# Patient Record
Sex: Female | Born: 1990 | Race: Black or African American | Hispanic: No | Marital: Single | Smoking: Former smoker
Health system: Southern US, Community
[De-identification: ages and names within clinical notes are randomized; demographics above are authoritative.]

## PROBLEM LIST (undated history)

## (undated) DIAGNOSIS — F419 Anxiety disorder, unspecified: Secondary | ICD-10-CM

## (undated) DIAGNOSIS — I729 Aneurysm of unspecified site: Secondary | ICD-10-CM

## (undated) DIAGNOSIS — G43909 Migraine, unspecified, not intractable, without status migrainosus: Secondary | ICD-10-CM

## (undated) DIAGNOSIS — L309 Dermatitis, unspecified: Secondary | ICD-10-CM

## (undated) DIAGNOSIS — F29 Unspecified psychosis not due to a substance or known physiological condition: Secondary | ICD-10-CM

## (undated) DIAGNOSIS — Z889 Allergy status to unspecified drugs, medicaments and biological substances status: Secondary | ICD-10-CM

## (undated) DIAGNOSIS — T783XXA Angioneurotic edema, initial encounter: Secondary | ICD-10-CM

## (undated) DIAGNOSIS — R569 Unspecified convulsions: Secondary | ICD-10-CM

## (undated) DIAGNOSIS — F329 Major depressive disorder, single episode, unspecified: Secondary | ICD-10-CM

## (undated) DIAGNOSIS — F32A Depression, unspecified: Secondary | ICD-10-CM

## (undated) HISTORY — PX: OTHER SURGICAL HISTORY: SHX169

## (undated) HISTORY — PX: TONSILLECTOMY: SUR1361

## (undated) HISTORY — DX: Angioneurotic edema, initial encounter: T78.3XXA

## (undated) HISTORY — PX: ADENOIDECTOMY: SUR15

## (undated) HISTORY — DX: Dermatitis, unspecified: L30.9

## (undated) HISTORY — PX: TYMPANOSTOMY TUBE PLACEMENT: SHX32

---
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
## 2011-01-09 ENCOUNTER — Inpatient Hospital Stay (INDEPENDENT_AMBULATORY_CARE_PROVIDER_SITE_OTHER)
Admission: RE | Admit: 2011-01-09 | Discharge: 2011-01-09 | Disposition: A | Discharge status: Home / Self Care | Payer: Medicaid Other | Source: Ambulatory Visit | Attending: Family Medicine | Admitting: Family Medicine

## 2011-01-09 DIAGNOSIS — J309 Allergic rhinitis, unspecified: Secondary | ICD-10-CM

## 2011-01-09 DIAGNOSIS — J45909 Unspecified asthma, uncomplicated: Secondary | ICD-10-CM

## 2011-01-14 LAB — CBC
HCT: 41.1 % (ref 36.0–49.0)
Hemoglobin: 14.1 g/dL (ref 12.0–16.0)
MCHC: 34.4 g/dL (ref 31.0–37.0)
MCV: 85.8 fL (ref 78.0–98.0)
Platelets: 299 10*3/uL (ref 150–400)
RBC: 4.8 MIL/uL (ref 3.80–5.70)
RDW: 13.6 % (ref 11.4–15.5)
WBC: 10.3 10*3/uL (ref 4.5–13.5)

## 2011-01-14 LAB — URINALYSIS, ROUTINE W REFLEX MICROSCOPIC
Bilirubin Urine: NEGATIVE
Glucose, UA: NEGATIVE mg/dL
Hgb urine dipstick: NEGATIVE
Ketones, ur: NEGATIVE mg/dL
Nitrite: NEGATIVE
Protein, ur: NEGATIVE mg/dL
Specific Gravity, Urine: 1.014 (ref 1.005–1.030)
Urobilinogen, UA: 0.2 mg/dL (ref 0.0–1.0)
pH: 6.5 (ref 5.0–8.0)

## 2011-01-14 LAB — DIFFERENTIAL
Basophils Absolute: 0 10*3/uL (ref 0.0–0.1)
Basophils Relative: 0 % (ref 0–1)
Eosinophils Absolute: 0.3 10*3/uL (ref 0.0–1.2)
Eosinophils Relative: 3 % (ref 0–5)
Lymphocytes Relative: 9 % — ABNORMAL LOW (ref 24–48)
Lymphs Abs: 0.9 10*3/uL — ABNORMAL LOW (ref 1.1–4.8)
Monocytes Absolute: 0.4 10*3/uL (ref 0.2–1.2)
Monocytes Relative: 4 % (ref 3–11)
Neutro Abs: 8.6 10*3/uL — ABNORMAL HIGH (ref 1.7–8.0)
Neutrophils Relative %: 84 % — ABNORMAL HIGH (ref 43–71)

## 2011-01-14 LAB — BASIC METABOLIC PANEL
BUN: 5 mg/dL — ABNORMAL LOW (ref 6–23)
CO2: 28 mEq/L (ref 19–32)
Calcium: 9.7 mg/dL (ref 8.4–10.5)
Chloride: 106 mEq/L (ref 96–112)
Creatinine, Ser: 0.66 mg/dL (ref 0.4–1.2)
Glucose, Bld: 99 mg/dL (ref 70–99)
Potassium: 4.2 mEq/L (ref 3.5–5.1)
Sodium: 141 mEq/L (ref 135–145)

## 2011-01-14 LAB — POCT PREGNANCY, URINE: Preg Test, Ur: NEGATIVE

## 2011-01-25 LAB — CBC
HCT: 32.4 % — ABNORMAL LOW (ref 36.0–49.0)
HCT: 34.6 % — ABNORMAL LOW (ref 36.0–49.0)
Hemoglobin: 10.8 g/dL — ABNORMAL LOW (ref 12.0–16.0)
Hemoglobin: 11.3 g/dL — ABNORMAL LOW (ref 12.0–16.0)
MCHC: 32.7 g/dL (ref 31.0–37.0)
MCHC: 33.2 g/dL (ref 31.0–37.0)
MCV: 88.7 fL (ref 78.0–98.0)
MCV: 89.6 fL (ref 78.0–98.0)
Platelets: 229 10*3/uL (ref 150–400)
Platelets: 264 10*3/uL (ref 150–400)
RBC: 3.65 MIL/uL — ABNORMAL LOW (ref 3.80–5.70)
RBC: 3.87 MIL/uL (ref 3.80–5.70)
RDW: 13 % (ref 11.4–15.5)
RDW: 13.5 % (ref 11.4–15.5)
WBC: 10.1 10*3/uL (ref 4.5–13.5)
WBC: 15.1 10*3/uL — ABNORMAL HIGH (ref 4.5–13.5)

## 2011-01-25 LAB — RPR: RPR Ser Ql: NONREACTIVE
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
## 2011-02-23 NOTE — Consult Note (Signed)
NAMELITZY, DICKER NO.:  [id number]   MEDICAL RECORD NO.:  [id number]          PATIENT TYPE:  EMS   LOCATION:  MAJO                         FACILITY:  [facility]   PHYSICIAN:  Antony Contras, MD     DATE OF BIRTH:  Sep 05, 1991   DATE OF CONSULTATION:  01/03/2009  DATE OF DISCHARGE:  01/03/2009                                 CONSULTATION   REQUESTING SERVICE:  Emergency Department.   CHIEF COMPLAINT:  Lip laceration.   HISTORY OF PRESENT ILLNESS:  The patient is a 20 year old African  American female, who fell against a headboard for her bed while tripping  over book bag at about 4:30 this afternoon.  She struck her face against  the headboard and her lip bled and hurt.  Pain is moderate.  She denies  any tooth damage.  She presents to the emergency department with this  injury.   PAST MEDICAL HISTORY:  1. Allergies.  2. Asthma.  3. Attention deficit disorder.  4. Bell palsy.  5. Depression.  6. Eczema.   PAST SURGICAL HISTORY:  Repair of left arm fracture and  adenotonsillectomy.   MEDICATIONS:  Albuterol and Singulair.   ALLERGIES:  No known drug allergies.   FAMILY HISTORY:  Hypertension and coronary artery disease.   SOCIAL HISTORY:  The patient lives with her mother and son.  She denies  smoking or alcohol use.   REVIEW OF SYSTEMS:  Negative except as listed above.   PHYSICAL EXAMINATION:  VITAL SIGNS:  Temperature 98.5, pulse 84, and  respirations 19.  GENERAL:  The patient is in no acute distress, is pleasant and  cooperative.  EYES:  Extraocular movements are intact.  Pupils are equal, round, and  reactive to light.  There is no orbital step-off.  EARS:  External ears are normal and external canals are patent.  Tympanic membranes are intact.  Middle ear spaces are aerated.  FACE:  There are no injuries to the face except to the upper lip as  described below.  NOSE:  External nose is normal.  Nasal passages are patent.  Septum is  relatively midline.  ORAL CAVITY/OROPHARYNX:  There is a 2.5-cm laceration of the right upper  lip that it crosses the vermilion border and extends through-and-through  into the inside of the lip in a vertical fashion.  The very top of the  laceration is split into 2 arms.  There are no other injuries to the  lips, teeth, oral cavity, or oropharynx.  NECK:  No tenderness or deformity.  CRANIAL NERVES:  II through XII are grossly intact.  THYROID:  Normal palpation.  LYMPHATIC:  No enlarged lymph nodes in the neck.  SALIVARY GLANDS:  Normal to palpation.   ASSESSMENT:  The patient is a 20 year old African American female with a  lip laceration.   PLAN:  The laceration will be repaired in the emergency department under  local anesthetic.  Risks, benefits, and alternatives were discussed.  Wound Care will consist of twice daily antibacterial ointment on the  outer part of the laceration.  The skin  sutures will be removed in about  a week in the office.       Antony Contras, MD  Electronically Signed     DDB/MEDQ  D:  01/03/2009  T:  01/04/2009  Job:  [id number]

## 2011-02-23 NOTE — Op Note (Signed)
NAMEXCARET, MORAD NO.:  [id number]   MEDICAL RECORD NO.:  [id number]          PATIENT TYPE:  EMS   LOCATION:  MAJO                         FACILITY:  [facility]   PHYSICIAN:  Antony Contras, MD     DATE OF BIRTH:  10/21/1990   DATE OF PROCEDURE:  01/03/2009  DATE OF DISCHARGE:  01/03/2009                               OPERATIVE REPORT   PREOPERATIVE DIAGNOSIS:  Right upper lip laceration, 2.5 cm.   POSTOPERATIVE DIAGNOSIS:  Right upper lip laceration, 2.5 cm.   PROCEDURE:  Intermediate complexity closure of upper lip laceration  totaling 2.5 cm.   SURGEON:  Antony Contras, MD.   ANESTHESIA:  Local.   COMPLICATIONS:  None.   INDICATION:  The patient is a 20 year old African American female who  struck her lip against the headboard when she tripped earlier this  afternoon.  She sustained a 2.5 cm vertical laceration through-and-  through the lip and across the vermilion border.  The laceration is  being closed in the operating room.   FINDINGS:  As above.   DESCRIPTION OF PROCEDURE:  The patient was identified in the emergency  department and informed consent was obtained including discussion of  risks, benefits, alternatives.  The upper lip and surrounding face was  prepped and draped in sterile fashion.  The laceration was copiously  irrigated with saline.  The deep muscle of the lip was then closed  together using 4-0 Vicryl suture in a simple interrupted fashion.  The  right upper lip was then closed with 5-0 chromic in a simple interrupted  fashion.  The vermilion border was repositioned with that suture.  The  skin portion of the laceration was then closed with 5-0 nylon in a  simple interrupted fashion.  After this, the patient return to emergency  room care and discharged with instructions.      Antony Contras, MD  Electronically Signed     DDB/MEDQ  D:  01/03/2009  T:  01/04/2009  Job:  [id number]

## 2011-02-26 NOTE — Discharge Summary (Signed)
Angela Spencer, Angela Spencer NO.:  [id number]   MEDICAL RECORD NO.:  [id number]           PATIENT TYPE:   LOCATION:                                 FACILITY:   PHYSICIAN:  Huel Cote, M.D. DATE OF BIRTH:  Dec 22, 1990   DATE OF ADMISSION:  11/08/2008  DATE OF DISCHARGE:                               DISCHARGE SUMMARY   DISCHARGE DIAGNOSES:  1. Term pregnancy at 39 weeks delivered.  2. Status post vacuum-assisted vaginal delivery.   DISCHARGE MEDICATIONS:  1. Motrin 600 mg p.o. every 6 hours.  2. Percocet 1-2 tablets p.o. every 4 hours.   DISCHARGE FOLLOWUP:  The patient is to follow up in the office in 6  weeks for her full postpartum exam.   HOSPITAL COURSE:  The patient is a 20 year old G1 P0 who came in at 38  weeks' gestation with complaint of contractions every 1-3 minutes and  rated her pain as 6/10.  She was observed with minimal cervical change  noted for 2 hours but she felt she could not go home and tolerate the  pain, and her mother was concerned about getting her back in inclement  weather as they live greater than 1 hour away; therefore, the patient  was allowed to stay and be medicated with the understanding if her  cervix did not change we would essentially began an induction of labor  as she was term status and favorable.  The patient and family were  agreeable.  Prenatal care was mostly complicated by significant social  issues of a 43 year old living with her boyfriend in a less than ideal  situation.  Mother was supportive, but the patient did not agree to live  with her.   Prenatal labs are as follows, O positive, antibody negative, rubella  immune, hepatitis B surface antigen negative, HIV negative, RPR  nonreactive, GC negative, chlamydia negative, group B strep negative, 1-  hour Glucola normal, and first trimester screen normal.   PAST OBSTETRICAL HISTORY:  None.   PAST GYN HISTORY:  History of an ASCUS Pap with [facility].   PAST  MEDICAL HISTORY:  Asthma and depression.   PAST SURGICAL HISTORY:  In 2000, she had some pins in her arm.   MEDICATIONS:  She uses albuterol p.r.n.  After being observed for  several hours, the patient's cervix really did not change, too  significantly she was 52 plus and -2 station; therefore, she had rupture  of membranes performed and was placed on Pitocin.  She did progress  reached approximately 5 cm and did have some decelerations down to  approximately 100 with some rebound tachycardia.  The strip was reviewed  personally by me and the time to be overall reassuring except for the  decelerations.  Therefore, she was allowed to continue to labor.  She  continued to have some intermittent decelerations.  However, the baby  always responded well to repositioning and scalp stem.  She eventually  reached complete dilation and pushed great despite persistent variable  decelerations with the pushes of fetal heart rate to 70-80.  The  patient's  baby's heart rate did recover back to baseline and maintained  excellent variability and scalp stem.  The patient was allowed to  continue pushing and did bring the vertex down to a +3 to +3 station.  After approximately 1 hour, however, given the persistent decelerations  and a slightly prolonged recovery that was beginning to be noted, the  patient was counseled and agreed to proceed with a vacuum-assisted  delivery to try to facilitate a quicker delivery of the baby.  She had a  soft cup vacuum applied to the vertex and was delivered over three  contractions.  There was a moderate shoulder dystocia encountered, which  was delivered with McRobertson posterior axillary lift.  The baby was  very floppy delivery with a corporal cord noted.  the Pediatrics was  called and assessed the baby.  Apgars were 2 and 9, weight was 7 pounds  4 ounces.  The baby did respond very quickly to stimulation and moved  both arms very well and appeared to be doing  fine.  The placenta  delivered spontaneously and then the patient had some uterine atony,  which was controlled with Pitocin and bimanual massage.  She had a small  first-degree laceration repaired with 3-0 Vicryl Rapide and a right  labial laceration also repaired.  On postpartum day #1, she was doing  quite well.  Hemoglobin was 10.8.  Fundus was firm.  By postpartum day  #2, she was felt stable for discharge home.  She was discharged with  office instructions and follow up in 6 weeks plan and also got a social  work consult given her significant social issues and has a plan of  support in place.      Huel Cote, M.D.  Electronically Signed     KR/MEDQ  D:  12/18/2008  T:  12/19/2008  Job:  [id number]
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
## 2011-07-05 LAB — CBC
HCT: 38.8
Hemoglobin: 12.8
MCHC: 33
MCV: 82
Platelets: 389
RBC: 4.74
RDW: 12.4
WBC: 7.8

## 2011-07-05 LAB — URINALYSIS, ROUTINE W REFLEX MICROSCOPIC
Bilirubin Urine: NEGATIVE
Glucose, UA: NEGATIVE
Hgb urine dipstick: NEGATIVE
Ketones, ur: NEGATIVE
Nitrite: NEGATIVE
Protein, ur: NEGATIVE
Specific Gravity, Urine: 1.015
Urobilinogen, UA: 0.2
pH: 7

## 2011-07-05 LAB — DRUGS OF ABUSE SCREEN W/O ALC, ROUTINE URINE
Amphetamine Screen, Ur: NEGATIVE
Barbiturate Quant, Ur: NEGATIVE
Benzodiazepines.: NEGATIVE
Cocaine Metabolites: NEGATIVE
Creatinine,U: 104.7
Marijuana Metabolite: NEGATIVE
Methadone: NEGATIVE
Opiate Screen, Urine: NEGATIVE
Phencyclidine (PCP): NEGATIVE
Propoxyphene: NEGATIVE

## 2011-07-05 LAB — DIFFERENTIAL
Basophils Absolute: 0
Basophils Relative: 0
Eosinophils Absolute: 0.5
Eosinophils Relative: 7 — ABNORMAL HIGH
Lymphocytes Relative: 20 — ABNORMAL LOW
Lymphs Abs: 1.6
Monocytes Absolute: 0.6
Monocytes Relative: 8
Neutro Abs: 5.1
Neutrophils Relative %: 65

## 2011-07-05 LAB — BASIC METABOLIC PANEL
BUN: 7
CO2: 27
Calcium: 9.3
Chloride: 106
Creatinine, Ser: 0.71
Glucose, Bld: 84
Potassium: 3.8
Sodium: 140

## 2011-07-05 LAB — GC/CHLAMYDIA PROBE AMP, URINE
Chlamydia, Swab/Urine, PCR: POSITIVE — AB
GC Probe Amp, Urine: NEGATIVE

## 2011-07-05 LAB — HEPATIC FUNCTION PANEL
ALT: 16
AST: 21
Albumin: 3.4 — ABNORMAL LOW
Alkaline Phosphatase: 76
Bilirubin, Direct: <0.1
Total Bilirubin: 0.4
Total Protein: 7.8

## 2011-07-05 LAB — TSH: TSH: 1.054

## 2011-07-05 LAB — URINE MICROSCOPIC-ADD ON

## 2011-07-05 LAB — T4, FREE: Free T4: 1.37

## 2011-07-05 LAB — GAMMA GT: GGT: 14

## 2011-07-05 LAB — PREGNANCY, URINE: Preg Test, Ur: NEGATIVE

## 2011-07-05 LAB — RPR: RPR Ser Ql: NONREACTIVE

## 2011-07-23 LAB — URINALYSIS, ROUTINE W REFLEX MICROSCOPIC
Bilirubin Urine: NEGATIVE
Glucose, UA: NEGATIVE
Hgb urine dipstick: NEGATIVE
Ketones, ur: 15 — AB
Nitrite: NEGATIVE
Protein, ur: NEGATIVE
Specific Gravity, Urine: 1.031 — ABNORMAL HIGH
Urobilinogen, UA: 1
pH: 5.5

## 2011-07-23 LAB — URINE CULTURE: Colony Count: 7000

## 2011-07-23 LAB — URINE MICROSCOPIC-ADD ON

## 2011-09-05 ENCOUNTER — Emergency Department (HOSPITAL_COMMUNITY): Payer: Medicaid Other

## 2011-09-05 ENCOUNTER — Encounter: Payer: Self-pay | Admitting: Emergency Medicine

## 2011-09-05 ENCOUNTER — Emergency Department (HOSPITAL_COMMUNITY)
Admission: EM | Admit: 2011-09-05 | Discharge: 2011-09-05 | Disposition: A | Discharge status: Home / Self Care | Payer: Medicaid Other | Attending: Emergency Medicine | Admitting: Emergency Medicine

## 2011-09-05 DIAGNOSIS — J45909 Unspecified asthma, uncomplicated: Secondary | ICD-10-CM | POA: Insufficient documentation

## 2011-09-05 DIAGNOSIS — M542 Cervicalgia: Secondary | ICD-10-CM | POA: Insufficient documentation

## 2011-09-05 DIAGNOSIS — R51 Headache: Secondary | ICD-10-CM

## 2011-09-05 HISTORY — DX: Allergy status to unspecified drugs, medicaments and biological substances: Z88.9

## 2011-09-05 HISTORY — DX: Migraine, unspecified, not intractable, without status migrainosus: G43.909

## 2011-09-05 MED ORDER — DIPHENHYDRAMINE HCL 50 MG/ML IJ SOLN
25.0000 mg | Freq: Once | INTRAMUSCULAR | Status: AC
  Administered 2011-09-05: 25 mg via INTRAMUSCULAR
  Filled 2011-09-05: qty 1

## 2011-09-05 MED ORDER — KETOROLAC TROMETHAMINE 60 MG/2ML IM SOLN
60.0000 mg | Freq: Once | INTRAMUSCULAR | Status: AC
  Administered 2011-09-05: 60 mg via INTRAMUSCULAR
  Filled 2011-09-05: qty 2

## 2011-09-05 MED ORDER — METOCLOPRAMIDE HCL 5 MG/ML IJ SOLN
10.0000 mg | Freq: Once | INTRAMUSCULAR | Status: AC
  Administered 2011-09-05: 10 mg via INTRAMUSCULAR
  Filled 2011-09-05: qty 2
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
## 2011-12-03 DIAGNOSIS — L723 Sebaceous cyst: Secondary | ICD-10-CM | POA: Insufficient documentation

## 2011-12-03 DIAGNOSIS — L209 Atopic dermatitis, unspecified: Secondary | ICD-10-CM | POA: Insufficient documentation

## 2012-01-24 ENCOUNTER — Emergency Department (HOSPITAL_COMMUNITY)
Admission: EM | Admit: 2012-01-24 | Discharge: 2012-01-24 | Disposition: A | Discharge status: Home / Self Care | Payer: Medicaid Other | Attending: Emergency Medicine | Admitting: Emergency Medicine

## 2012-01-24 ENCOUNTER — Encounter (HOSPITAL_COMMUNITY): Payer: Self-pay

## 2012-01-24 DIAGNOSIS — R0602 Shortness of breath: Secondary | ICD-10-CM | POA: Insufficient documentation

## 2012-01-24 DIAGNOSIS — J45901 Unspecified asthma with (acute) exacerbation: Secondary | ICD-10-CM

## 2012-01-24 MED ORDER — PREDNISONE 20 MG PO TABS: 40.0000 mg | ORAL_TABLET | Freq: Every day | ORAL | Status: DC

## 2012-01-24 MED ORDER — ALBUTEROL SULFATE (5 MG/ML) 0.5% IN NEBU
5.0000 mg | INHALATION_SOLUTION | Freq: Once | RESPIRATORY_TRACT | Status: AC
  Administered 2012-01-24: 5 mg via RESPIRATORY_TRACT

## 2012-01-24 MED ORDER — ALBUTEROL SULFATE (5 MG/ML) 0.5% IN NEBU
INHALATION_SOLUTION | RESPIRATORY_TRACT | Status: AC
  Filled 2012-01-24: qty 1

## 2012-01-24 MED ORDER — PREDNISONE 20 MG PO TABS
60.0000 mg | ORAL_TABLET | Freq: Once | ORAL | Status: AC
  Administered 2012-01-24: 60 mg via ORAL
  Filled 2012-01-24: qty 3

## 2012-01-24 MED ORDER — ALBUTEROL SULFATE (5 MG/ML) 0.5% IN NEBU
10.0000 mg | INHALATION_SOLUTION | Freq: Once | RESPIRATORY_TRACT | Status: AC
  Administered 2012-01-24: 10 mg via RESPIRATORY_TRACT
  Filled 2012-01-24: qty 2

## 2012-01-24 MED ORDER — IPRATROPIUM BROMIDE 0.02 % IN SOLN
0.5000 mg | Freq: Once | RESPIRATORY_TRACT | Status: AC
  Administered 2012-01-24: 0.5 mg via RESPIRATORY_TRACT
  Filled 2012-01-24: qty 2.5

## 2012-01-24 NOTE — ED Notes (Signed)
Patient presents with shortness of breath since 1900 last night. Patient reporting she was fishing all weekend and has seasonal allergies, has used up her inhaler and continues to have SOB.  Expiratory wheezing noted upon auscultation, especially to bilateral bases.

## 2012-01-24 NOTE — ED Provider Notes (Signed)
History     CSN: [id number]  Arrival date & time 01/24/12  4696   First MD Initiated Contact with Patient 01/24/12 [phone]      Chief Complaint  Patient presents with  . Shortness of Breath    (Consider location/radiation/quality/duration/timing/severity/associated sxs/prior treatment) Patient is a 21 y.o. female presenting with shortness of breath. The history is provided by the patient.  Shortness of Breath  The current episode started yesterday. The onset was gradual. The problem occurs continuously. The problem has been gradually worsening. The problem is severe. The symptoms are relieved by nothing. The symptoms are aggravated by allergens. Associated symptoms include shortness of breath and wheezing. Pertinent negatives include no chest pain, no chest pressure and no fever. She has not inhaled smoke recently. She has had intermittent steroid use. She has had prior hospitalizations. She has had no prior ICU admissions. She has had no prior intubations. Her past medical history is significant for asthma and past wheezing. Recent Medical Care: has used rescue inhaler without relief.  Pt treated prior to my eval by nurse in triage with albuterol breathing tx with some relief.  Past Medical History  Diagnosis Date  . Asthma   . Multiple allergies   . Migraine headache     History reviewed. No pertinent past surgical history.  No family history on file.  History  Substance Use Topics  . Smoking status: Never Smoker   . Smokeless tobacco: Not on file  . Alcohol Use: No    Review of Systems  Constitutional: Negative for fever and chills.  Respiratory: Positive for chest tightness, shortness of breath and wheezing.   Cardiovascular: Negative for chest pain.  10 systems reviewed and are otherwise negative for acute change except as noted in the HPI.   Allergies  Flexeril  Home Medications   Current Outpatient Rx  Name Route Sig Dispense Refill  . ALBUTEROL SULFATE HFA 108  (90 BASE) MCG/ACT IN AERS Inhalation Inhale 2 puffs into the lungs every 6 (six) hours as needed. For shortness of breath/wheezing      BP 135/72  Pulse 92  Temp(Src) 98.3 F (36.8 C) (Oral)  Resp 16  SpO2 99%  LMP 12/24/2011  Physical Exam  Nursing note and vitals reviewed. Constitutional: She is oriented to person, place, and time. She appears well-developed and well-nourished. No distress.  HENT:  Head: Normocephalic and atraumatic.  Right Ear: External ear normal.  Left Ear: External ear normal.  Mouth/Throat: Oropharynx is clear and moist. No oropharyngeal exudate.  Eyes: Conjunctivae are normal. Pupils are equal, round, and reactive to light.  Neck: Normal range of motion. Neck supple.  Cardiovascular: Regular rhythm, normal heart sounds and intact distal pulses.        Slight tachycardia  Pulmonary/Chest: No accessory muscle usage. Not tachypneic. No respiratory distress. She has decreased breath sounds. She has wheezes. She has no rales.  Abdominal: Soft. She exhibits no distension. There is no tenderness.  Musculoskeletal: She exhibits no edema.  Neurological: She is alert and oriented to person, place, and time.  Skin: Skin is warm and dry.  Psychiatric: She has a normal mood and affect.    ED Course  Procedures (including critical care time)  Labs Reviewed - No data to display No results found.   Dx 1: Acute asthma exacerbation   MDM  Asthma exacerbation. Hour-long neb ordered as pt had only partial relief with initial neb tx.  After hour-long tx, pt reports significant symptom improvement. No wheezing  heard, greatly increased air movement. Pt feels ready for d/c home. Has inhaler and neb tx to use at home PRN. Will give short course of steroids.        Shaaron Adler, [place] 01/24/12 1243

## 2012-01-24 NOTE — Discharge Instructions (Signed)
Asthma, Adult  Asthma is a disease of the lungs and can make it hard to breathe. Asthma cannot be cured, but medicine can help control it. Asthma may be started (triggered) by:   Pollen.   Dust.   Animal skin flakes (dander).   Molds.   Foods.   Respiratory infections (colds, flu).   Smoke.   Exercise.   Stress.   Other things that cause allergic reactions or allergies (allergens).  HOME CARE    Talk to your doctor about how to manage your attacks at home. This may include:   Using a tool called a peak flow meter.   Having medicine ready to stop the attack.   Take all medicine as told by your doctor.   Wash bed sheets and blankets every week in hot water and put them in the dryer.   Drink enough fluids to keep your pee (urine) clear or pale yellow.   Always be ready to get emergency help. Write down the phone number for your doctor. Keep it where you can easily find it.   Talk about exercise routines with your doctor.   If animal dander is causing your asthma, you may need to find a new home for your pet(s).  GET HELP RIGHT AWAY IF:    You have muscle aches.   You cough more.   You have chest pain.   You have thick spit (sputum) that changes to yellow, green, gray, or bloody.   Medicine does not stop your wheezing.   You have problems breathing.   You have a fever.   Your medicine causes:   A rash.   Itching.   Puffiness (swelling).   Breathing problems.  MAKE SURE YOU:    Understand these instructions.   Will watch your condition.   Will get help right away if you are not doing well or get worse.  Document Released: 03/15/2008 Document Revised: 09/16/2011 Document Reviewed: 08/07/2008  Exit[employer] Patient Information 2012 [employer].

## 2012-01-24 NOTE — ED Provider Notes (Signed)
Medical screening examination/treatment/procedure(s) were performed by non-physician practitioner and as supervising physician I was immediately available for consultation/collaboration.   Lashaye Fisk L Shermar Friedland, MD 01/24/12 1413 

## 2012-08-09 ENCOUNTER — Emergency Department (HOSPITAL_COMMUNITY): Payer: Medicaid Other

## 2012-08-09 ENCOUNTER — Emergency Department (HOSPITAL_COMMUNITY)
Admission: EM | Admit: 2012-08-09 | Discharge: 2012-08-09 | Disposition: A | Discharge status: Home / Self Care | Payer: Medicaid Other | Attending: Emergency Medicine | Admitting: Emergency Medicine

## 2012-08-09 ENCOUNTER — Encounter (HOSPITAL_COMMUNITY): Payer: Self-pay | Admitting: Emergency Medicine

## 2012-08-09 DIAGNOSIS — Z8679 Personal history of other diseases of the circulatory system: Secondary | ICD-10-CM | POA: Insufficient documentation

## 2012-08-09 DIAGNOSIS — Z8669 Personal history of other diseases of the nervous system and sense organs: Secondary | ICD-10-CM | POA: Insufficient documentation

## 2012-08-09 DIAGNOSIS — J45909 Unspecified asthma, uncomplicated: Secondary | ICD-10-CM | POA: Insufficient documentation

## 2012-08-09 DIAGNOSIS — F17200 Nicotine dependence, unspecified, uncomplicated: Secondary | ICD-10-CM | POA: Insufficient documentation

## 2012-08-09 DIAGNOSIS — H539 Unspecified visual disturbance: Secondary | ICD-10-CM | POA: Insufficient documentation

## 2012-08-09 DIAGNOSIS — R51 Headache: Secondary | ICD-10-CM | POA: Insufficient documentation

## 2012-08-09 DIAGNOSIS — R450 Nervousness: Secondary | ICD-10-CM | POA: Insufficient documentation

## 2012-08-09 DIAGNOSIS — Z79899 Other long term (current) drug therapy: Secondary | ICD-10-CM | POA: Insufficient documentation

## 2012-08-09 DIAGNOSIS — N390 Urinary tract infection, site not specified: Secondary | ICD-10-CM | POA: Insufficient documentation

## 2012-08-09 DIAGNOSIS — R42 Dizziness and giddiness: Secondary | ICD-10-CM | POA: Insufficient documentation

## 2012-08-09 HISTORY — DX: Aneurysm of unspecified site: I72.9

## 2012-08-09 LAB — URINALYSIS, ROUTINE W REFLEX MICROSCOPIC
Bilirubin Urine: NEGATIVE
Glucose, UA: NEGATIVE mg/dL
Ketones, ur: NEGATIVE mg/dL
Nitrite: NEGATIVE
Protein, ur: 30 mg/dL — AB
Specific Gravity, Urine: 1.023 (ref 1.005–1.030)
Urobilinogen, UA: 0.2 mg/dL (ref 0.0–1.0)
pH: 6 (ref 5.0–8.0)

## 2012-08-09 LAB — URINE MICROSCOPIC-ADD ON

## 2012-08-09 LAB — PREGNANCY, URINE: Preg Test, Ur: NEGATIVE

## 2012-08-09 MED ORDER — METOCLOPRAMIDE HCL 5 MG/ML IJ SOLN
10.0000 mg | Freq: Once | INTRAMUSCULAR | Status: AC
  Administered 2012-08-09: 10 mg via INTRAVENOUS
  Filled 2012-08-09: qty 2

## 2012-08-09 MED ORDER — DIAZEPAM 5 MG/ML IJ SOLN
2.5000 mg | Freq: Once | INTRAMUSCULAR | Status: AC
  Administered 2012-08-09: 2.5 mg via INTRAVENOUS
  Filled 2012-08-09: qty 2

## 2012-08-09 MED ORDER — NITROFURANTOIN MONOHYD MACRO 100 MG PO CAPS: 100.0000 mg | ORAL_CAPSULE | Freq: Two times a day (BID) | ORAL | Status: DC

## 2012-08-09 MED ORDER — DIPHENHYDRAMINE HCL 50 MG/ML IJ SOLN
12.5000 mg | Freq: Once | INTRAMUSCULAR | Status: AC
  Administered 2012-08-09: 12.5 mg via INTRAVENOUS
  Filled 2012-08-09: qty 1

## 2012-08-09 MED ORDER — SODIUM CHLORIDE 0.9 % IV BOLUS (SEPSIS)
1000.0000 mL | Freq: Once | INTRAVENOUS | Status: AC
  Administered 2012-08-09: 1000 mL via INTRAVENOUS

## 2012-08-09 MED ORDER — DEXAMETHASONE SODIUM PHOSPHATE 4 MG/ML IJ SOLN
4.0000 mg | Freq: Once | INTRAMUSCULAR | Status: AC
  Administered 2012-08-09: 4 mg via INTRAVENOUS
  Filled 2012-08-09: qty 1

## 2012-08-09 NOTE — ED Provider Notes (Signed)
Medical screening examination/treatment/procedure(s) were performed by non-physician practitioner and as supervising physician I was immediately available for consultation/collaboration.  Kyri Shader, MD 08/09/12 1602 

## 2012-08-09 NOTE — ED Notes (Signed)
Pt stated that headache goes away and then comes back.

## 2012-08-09 NOTE — ED Notes (Addendum)
Pt is extremely upset and hyperventilating yelling stating "I don't want to die"  C/o headache and dizziness. Stated that her vision is blurry. Denies pain anywhere else. Stated that she was here previously a while back and "passed out". When she went to bed last night she felt normal and woke up this morning feeling terrible with dizziness, nausea and extreme headache pain. No diarrhea. 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

## 2012-08-09 NOTE — ED Provider Notes (Signed)
History     CSN: [id number]  Arrival date & time 08/09/12  0927   First MD Initiated Contact with Patient 08/09/12 [phone]      Chief Complaint  Patient presents with  . Headache  . Dizziness    (Consider location/radiation/quality/duration/timing/severity/associated sxs/prior treatment) HPI Comments: Angela Spencer is a 21 y.o. Female who presented to ED with acute onset of a headache this morning while watching TV. States pain is all over. Associated with nausea, dizziness, "everything is spinning."  Pt did not take any medications prior to coming. Sensitive to light, sound, worsened with movement. Nothing makes it better. States hx of similar headaches. Reports remote history of an aneurism years ago? States "I feel like i am dying." Pt is very tearful, screaming.    Past Medical History  Diagnosis Date  . Asthma   . Multiple allergies   . Migraine headache   . Aneurysm     head    Past Surgical History  Procedure Date  . Tonsillectomy   . Adenoidectomy   . Arm surgery     broken left arm at elbow    No family history on file.  History  Substance Use Topics  . Smoking status: Current Every Day Smoker -- 0.5 packs/day  . Smokeless tobacco: Not on file  . Alcohol Use: No    OB History    Grav Para Term Preterm Abortions TAB SAB Ect Mult Living                  Review of Systems  Constitutional: Negative for fever, chills and diaphoresis.  HENT: Negative for ear pain, neck pain, neck stiffness and sinus pressure.   Eyes: Positive for visual disturbance. Negative for pain.  Respiratory: Negative.   Cardiovascular: Negative.   Gastrointestinal: Negative.   Musculoskeletal: Negative.   Skin: Negative.   Neurological: Positive for dizziness, light-headedness and headaches. Negative for syncope, facial asymmetry, weakness and numbness.  Hematological: Does not bruise/bleed easily.  Psychiatric/Behavioral: The patient is nervous/anxious.     Allergies    Flexeril  Home Medications   Current Outpatient Rx  Name Route Sig Dispense Refill  . ALBUTEROL SULFATE HFA 108 (90 BASE) MCG/ACT IN AERS Inhalation Inhale 2 puffs into the lungs every 6 (six) hours as needed. For shortness of breath/wheezing      BP 116/50  Pulse 104  Temp 98.4 F (36.9 C)  Resp 22  SpO2 97%  LMP 07/30/2012  Physical Exam  Nursing note and vitals reviewed. Constitutional: She is oriented to person, place, and time. She appears well-developed and well-nourished.       Appears in a lot of pain, crying and screaming, refuses to open eyes  HENT:  Head: Normocephalic.  Eyes: Conjunctivae normal are normal.  Neck: Normal range of motion. Neck supple.       No meningismus  Cardiovascular: Normal rate, regular rhythm and normal heart sounds.   Pulmonary/Chest: Effort normal and breath sounds normal. No respiratory distress. She has no wheezes. She has no rales.  Abdominal: Soft. Bowel sounds are normal. She exhibits no distension. There is no tenderness.  Musculoskeletal: She exhibits no edema.  Neurological: She is alert and oriented to person, place, and time. No cranial nerve deficit. Coordination normal.       5/5 and equal upper and lower extremity strength bilaterally. Equal grip strength bilaterally. Normal finger to nose. No pronator drift.  Skin: Skin is warm and dry.  Psychiatric:  Pt anxious, crying and screaming    ED Course  Procedures (including critical care time)  Pt with severe headache, acute onset, prior to the arrival. Questionable hx of aneurism, review of the chart showed negative MRA in 2011. Will get CT head, at this point, give relatively recent onset of pain, good sensitivity for picking up intracranial bleed. Will try some medications.   Results for orders placed during the hospital encounter of 08/09/12  URINALYSIS, ROUTINE W REFLEX MICROSCOPIC      Component Value Range   Color, Urine YELLOW  YELLOW   APPearance HAZY (*)  CLEAR   Specific Gravity, Urine 1.023  1.005 - 1.030   pH 6.0  5.0 - 8.0   Glucose, UA NEGATIVE  NEGATIVE mg/dL   Hgb urine dipstick LARGE (*) NEGATIVE   Bilirubin Urine NEGATIVE  NEGATIVE   Ketones, ur NEGATIVE  NEGATIVE mg/dL   Protein, ur 30 (*) NEGATIVE mg/dL   Urobilinogen, UA 0.2  0.0 - 1.0 mg/dL   Nitrite NEGATIVE  NEGATIVE   Leukocytes, UA MODERATE (*) NEGATIVE  PREGNANCY, URINE      Component Value Range   Preg Test, Ur NEGATIVE  NEGATIVE  URINE MICROSCOPIC-ADD ON      Component Value Range   Squamous Epithelial / LPF FEW (*) RARE   WBC, UA 11-20  <3 WBC/hpf   RBC / HPF 3-6  <3 RBC/hpf   Bacteria, UA MANY (*) RARE   Ct Head Wo Contrast  08/09/2012  *RADIOLOGY REPORT*  Clinical Data: Headache, dizziness  CT HEAD WITHOUT CONTRAST  Technique:  Contiguous axial images were obtained from the base of the skull through the vertex without contrast.  Comparison: 11/25/ 12  Findings: No skull fracture is noted.  Paranasal sinuses and mastoid air cells are unremarkable.  No intracranial hemorrhage, mass effect or midline shift.  No hydrocephalus.  The gray and white matter differentiation is preserved.  No acute infarction.  No mass lesion is noted on this unenhanced scan.  No hydrocephalus.  IMPRESSION: No acute intracranial abnormality.  No significant change.   Original Report Authenticated By: Natasha Mead, M.D.       1. Headache   2. UTI (lower urinary tract infection)       MDM  CT negative. HA resolved with reglan, benadryl, valium. Pt has normal neuro exam. Doubt a bleed. UA infected. Urine preg negative. Pt is stable for d/c home at this time. Pt does have hx of similar headaches in the past, questionable migraines? Pt is comfortable with the plan of going home. Will d/c home.         Lottie Mussel, PA 08/09/12 1506

## 2012-08-10 LAB — URINE CULTURE
Colony Count: NO GROWTH
Culture: NO GROWTH

## 2012-08-22 ENCOUNTER — Inpatient Hospital Stay (HOSPITAL_COMMUNITY)
Admission: AD | Admit: 2012-08-22 | Discharge: 2012-08-22 | Disposition: A | Discharge status: Home / Self Care | Payer: Medicaid Other | Source: Ambulatory Visit | Attending: Obstetrics and Gynecology | Admitting: Obstetrics and Gynecology

## 2012-08-22 ENCOUNTER — Inpatient Hospital Stay (HOSPITAL_COMMUNITY): Payer: Medicaid Other

## 2012-08-22 ENCOUNTER — Encounter (HOSPITAL_COMMUNITY): Payer: Self-pay

## 2012-08-22 DIAGNOSIS — N760 Acute vaginitis: Secondary | ICD-10-CM | POA: Insufficient documentation

## 2012-08-22 DIAGNOSIS — N949 Unspecified condition associated with female genital organs and menstrual cycle: Secondary | ICD-10-CM

## 2012-08-22 DIAGNOSIS — R102 Pelvic and perineal pain unspecified side: Secondary | ICD-10-CM

## 2012-08-22 DIAGNOSIS — R079 Chest pain, unspecified: Secondary | ICD-10-CM | POA: Insufficient documentation

## 2012-08-22 DIAGNOSIS — A499 Bacterial infection, unspecified: Secondary | ICD-10-CM | POA: Insufficient documentation

## 2012-08-22 DIAGNOSIS — R109 Unspecified abdominal pain: Secondary | ICD-10-CM | POA: Insufficient documentation

## 2012-08-22 DIAGNOSIS — T8339XA Other mechanical complication of intrauterine contraceptive device, initial encounter: Secondary | ICD-10-CM | POA: Insufficient documentation

## 2012-08-22 DIAGNOSIS — B9689 Other specified bacterial agents as the cause of diseases classified elsewhere: Secondary | ICD-10-CM | POA: Insufficient documentation

## 2012-08-22 LAB — URINALYSIS, ROUTINE W REFLEX MICROSCOPIC
Bilirubin Urine: NEGATIVE
Glucose, UA: NEGATIVE mg/dL
Hgb urine dipstick: NEGATIVE
Ketones, ur: NEGATIVE mg/dL
Leukocytes, UA: NEGATIVE
Nitrite: NEGATIVE
Protein, ur: NEGATIVE mg/dL
Specific Gravity, Urine: >1.03 — ABNORMAL HIGH (ref 1.005–1.030)
Urobilinogen, UA: 0.2 mg/dL (ref 0.0–1.0)
pH: 5.5 (ref 5.0–8.0)

## 2012-08-22 LAB — WET PREP, GENITAL
Trich, Wet Prep: NONE SEEN
Yeast Wet Prep HPF POC: NONE SEEN

## 2012-08-22 LAB — POCT PREGNANCY, URINE: Preg Test, Ur: NEGATIVE

## 2012-08-22 MED ORDER — KETOROLAC TROMETHAMINE 60 MG/2ML IM SOLN
60.0000 mg | Freq: Once | INTRAMUSCULAR | Status: AC
  Administered 2012-08-22: 60 mg via INTRAMUSCULAR
  Filled 2012-08-22: qty 2

## 2012-08-22 MED ORDER — ACETAMINOPHEN-CODEINE 300-30 MG PO TABS: 1.0000 | ORAL_TABLET | ORAL | Status: DC | PRN

## 2012-08-22 MED ORDER — METRONIDAZOLE 500 MG PO TABS: 500.0000 mg | ORAL_TABLET | Freq: Two times a day (BID) | ORAL | Status: DC

## 2012-08-22 NOTE — MAU Provider Note (Signed)
History     CSN: [id number]  Arrival date and time: 08/22/12 0907   First Provider Initiated Contact with Patient 08/22/12 [phone]      Chief Complaint  Patient presents with  . Abdominal Pain  . Chest Pain   HPI  Patient states that she had a Mirena IUD placed August 2011 and  was told in [place] that it was not exactly where it should be in November 2112.  Pt reports having left side pain, right rib and right upper abdominal pain.  States pain on right rib area is the most painful.  Pain is described as a sharp pain with deep inhalation x 3 days.    Past Medical History  Diagnosis Date  . Asthma   . Multiple allergies   . Migraine headache   . 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 Tonsillectomy   . Adenoidectomy   . Arm surgery     broken left arm at elbow    No family history on file.  History  Substance Use Topics  . Smoking status: Current Every Day Smoker -- 0.5 packs/day  . Smokeless tobacco: Not on file  . Alcohol Use: No    Allergies:  Allergies  Allergen Reactions  . Flexeril (Cyclobenzaprine Hcl) Hives    sweat    Prescriptions prior to admission  Medication Sig Dispense Refill  . albuterol (PROVENTIL HFA;VENTOLIN HFA) 108 (90 BASE) MCG/ACT inhaler Inhale 2 puffs into the lungs every 6 (six) hours as needed. For shortness of breath/wheezing      . Triamcinolone Acetonide (TRIAMCINOLONE 0.1 % CREAM : EUCERIN) CREA Apply 1 application topically daily.        ROS Physical Exam   Temperature 98.7 F (37.1 C), temperature source Oral, resp. rate 18, height 5\' 3"  (1.6 m), weight 77.565 kg (171 lb), last menstrual period 07/30/2012.  Physical Exam  Constitutional: She is oriented to person, place, and time. She appears well-developed and well-nourished. No distress.  HENT:  Head: Normocephalic.  Neck: Normal range of motion. Neck supple.  Cardiovascular: Normal rate and regular rhythm.     Respiratory: Effort normal and breath sounds normal. No respiratory distress. She has no wheezes. She has no rales. She exhibits tenderness (with palpation at right rib border).  GI: Soft. There is no tenderness (lower pelvic).  Genitourinary: Cervix exhibits motion tenderness. Vaginal discharge (white, creamy; frothy) found.       +fishy odor  Neurological: She is alert and oriented to person, place, and time.  Skin: Skin is warm and dry.    MAU Course  Procedures  Dr. Senaida Ores called and notified regarding patient HPI/exam > remove IUD and follow-up for family planning asap.  IUD removed without difficulty.  Results for orders placed during the hospital encounter of 08/22/12 (from the past 24 hour(s))  URINALYSIS, ROUTINE W REFLEX MICROSCOPIC     Status: Abnormal   Collection Time   08/22/12  9:10 AM      Component Value Range   Color, Urine YELLOW  YELLOW   APPearance CLEAR  CLEAR   Specific Gravity, Urine >1.030 (*) 1.005 - 1.030   pH 5.5  5.0 - 8.0   Glucose, UA NEGATIVE  NEGATIVE mg/dL   Hgb urine dipstick NEGATIVE  NEGATIVE   Bilirubin Urine NEGATIVE  NEGATIVE   Ketones, ur NEGATIVE  NEGATIVE mg/dL   Protein, ur NEGATIVE  NEGATIVE mg/dL  Urobilinogen, UA 0.2  0.0 - 1.0 mg/dL   Nitrite NEGATIVE  NEGATIVE   Leukocytes, UA NEGATIVE  NEGATIVE  POCT PREGNANCY, URINE     Status: Normal   Collection Time   08/22/12  9:41 AM      Component Value Range   Preg Test, Ur NEGATIVE  NEGATIVE  WET PREP, GENITAL     Status: Abnormal   Collection Time   08/22/12 10:18 AM      Component Value Range   Yeast Wet Prep HPF POC NONE SEEN  NONE SEEN   Trich, Wet Prep NONE SEEN  NONE SEEN   Clue Cells Wet Prep HPF POC FEW (*) NONE SEEN   WBC, Wet Prep HPF POC FEW (*) NONE SEEN    Ultrasound: IMPRESSION:  1. Abnormal IUD location in the inferior portion of endometrial  cavity and endocervical canal. Penetration into the lower uterine  segment myometrium cannot be excluded.  2.  Normal ovaries. No adnexal mass or free fluid identified.   Assessment and Plan  Chest wall pain Bacterial Vaginosis Pelvic Pain - IUD Malposition  Plan: IUD removed RX Flagyl Tylenol#3 Follow-up with PCP if chest wall pain does not improve or worsens.  [facility] 08/22/2012, 9:43 AM

## 2012-08-22 NOTE — MAU Note (Signed)
Patient states that she has the Mirena IUD was told in [facility] it was not exactly where it should be last year, having kidney, rib and abdominal pain, was treated for a UTI fairly recently and has completed all her medication for this.

## 2012-08-24 LAB — GC/CHLAMYDIA PROBE AMP, GENITAL
Chlamydia, DNA Probe: POSITIVE — AB
GC Probe Amp, Genital: NEGATIVE

## 2013-03-30 ENCOUNTER — Emergency Department (HOSPITAL_COMMUNITY): Payer: Medicaid Other

## 2013-03-30 ENCOUNTER — Emergency Department (HOSPITAL_COMMUNITY)
Admission: EM | Admit: 2013-03-30 | Discharge: 2013-03-30 | Disposition: A | Discharge status: Home / Self Care | Payer: Medicaid Other | Attending: Emergency Medicine | Admitting: Emergency Medicine

## 2013-03-30 ENCOUNTER — Encounter (HOSPITAL_COMMUNITY): Payer: Self-pay | Admitting: *Deleted

## 2013-03-30 DIAGNOSIS — F17200 Nicotine dependence, unspecified, uncomplicated: Secondary | ICD-10-CM | POA: Insufficient documentation

## 2013-03-30 DIAGNOSIS — Z8679 Personal history of other diseases of the circulatory system: Secondary | ICD-10-CM | POA: Insufficient documentation

## 2013-03-30 DIAGNOSIS — S93409A Sprain of unspecified ligament of unspecified ankle, initial encounter: Secondary | ICD-10-CM | POA: Insufficient documentation

## 2013-03-30 DIAGNOSIS — Y9344 Activity, trampolining: Secondary | ICD-10-CM | POA: Insufficient documentation

## 2013-03-30 DIAGNOSIS — X500XXA Overexertion from strenuous movement or load, initial encounter: Secondary | ICD-10-CM | POA: Insufficient documentation

## 2013-03-30 DIAGNOSIS — J45909 Unspecified asthma, uncomplicated: Secondary | ICD-10-CM | POA: Insufficient documentation

## 2013-03-30 DIAGNOSIS — Y929 Unspecified place or not applicable: Secondary | ICD-10-CM | POA: Insufficient documentation

## 2013-03-30 MED ORDER — IBUPROFEN 800 MG PO TABS
800.0000 mg | ORAL_TABLET | Freq: Once | ORAL | Status: AC
  Administered 2013-03-30: 800 mg via ORAL
  Filled 2013-03-30: qty 1

## 2013-03-30 MED ORDER — IBUPROFEN 800 MG PO TABS: 800.0000 mg | ORAL_TABLET | Freq: Three times a day (TID) | ORAL | Status: DC | PRN

## 2013-03-30 NOTE — ED Notes (Signed)
Pt hurt left ankle yesterday on trampoline

## 2013-03-30 NOTE — ED Provider Notes (Signed)
History     CSN: [id number]  Arrival date & time 03/30/13  0453   First MD Initiated Contact with Patient 03/30/13 0459      Chief Complaint  Patient presents with  . Ankle Pain    (Consider location/radiation/quality/duration/timing/severity/associated sxs/prior treatment) HPI 22 yo female presents to the ER with complaint of left ankle pain.  Pt reports she was jumping on a trampoline yesterday and inverted her ankle.  Pt was able to ambulate, has been icing, but pain and swelling to medial ankle is worsening.  Past Medical History  Diagnosis Date  . Asthma   . Multiple allergies   . Migraine headache   . 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 Tonsillectomy    . Adenoidectomy    . Arm surgery      broken left arm at elbow    No family history on file.  History  Substance Use Topics  . Smoking status: Current Every Day Smoker -- 0.50 packs/day  . Smokeless tobacco: Not on file  . Alcohol Use: No    OB History   Grav Para Term Preterm Abortions TAB SAB Ect Mult Living   3 2 2  1  1   1       Review of Systems  All other systems reviewed and are negative.    Allergies  Flexeril and Tramadol  Home Medications   Current Outpatient Rx  Name  Route  Sig  Dispense  Refill  . acetaminophen (TYLENOL) 325 MG tablet   Oral   Take 650 mg by mouth every 6 (six) hours as needed for pain.         . Triamcinolone Acetonide (TRIAMCINOLONE 0.1 % CREAM : EUCERIN) CREA   Topical   Apply 1 application topically daily.         Marland Kitchen ibuprofen (ADVIL,MOTRIN) 800 MG tablet   Oral   Take 1 tablet (800 mg total) by mouth every 8 (eight) hours as needed for pain.   30 tablet   0     BP 123/70  Pulse 77  Temp(Src) 98.3 F (36.8 C) (Oral)  Resp 20  SpO2 98%  LMP 02/28/2013  Physical Exam  Nursing note and vitals reviewed. Constitutional: She appears well-developed and well-nourished. No distress.   Musculoskeletal:  Decreased ROM due to pain.  Medial malleolus with slight edema.  Normal pulses and sensation.  No deformity or crepitus  Skin: Skin is warm and dry. No rash noted. She is not diaphoretic. No erythema. No pallor.    ED Course  Procedures (including critical care time)  Labs Reviewed - No data to display Dg Ankle Complete Left  03/30/2013   *RADIOLOGY REPORT*  Clinical Data: Ankle pain.  LEFT ANKLE COMPLETE - 3+ VIEW  Comparison: 01/21/2011.  Findings: Three views of the left ankle demonstrate no acute displaced fracture, subluxation, dislocation, joint or soft tissue abnormality.  IMPRESSION: 1.  No acute radiographic abnormality of the left ankle.   Original Report Authenticated By: Trudie Reed, M.D.     1. Ankle sprain and strain, left, initial encounter       MDM  22 yo female s/p fall on trampoline, negative xrays.  Will place on crutches, splint and f/u ortho as needed.  RICE therapy encouraged.        Olivia Mackie, MD 03/30/13 [phone]

## 2013-07-15 ENCOUNTER — Encounter (HOSPITAL_COMMUNITY): Payer: Self-pay | Admitting: Family Medicine

## 2013-07-15 ENCOUNTER — Emergency Department (HOSPITAL_COMMUNITY)
Admission: EM | Admit: 2013-07-15 | Discharge: 2013-07-15 | Disposition: A | Discharge status: Home / Self Care | Payer: Medicaid Other | Attending: Emergency Medicine | Admitting: Emergency Medicine

## 2013-07-15 DIAGNOSIS — IMO0002 Reserved for concepts with insufficient information to code with codable children: Secondary | ICD-10-CM | POA: Insufficient documentation

## 2013-07-15 DIAGNOSIS — H6692 Otitis media, unspecified, left ear: Secondary | ICD-10-CM

## 2013-07-15 DIAGNOSIS — Z8679 Personal history of other diseases of the circulatory system: Secondary | ICD-10-CM | POA: Insufficient documentation

## 2013-07-15 DIAGNOSIS — F17200 Nicotine dependence, unspecified, uncomplicated: Secondary | ICD-10-CM | POA: Insufficient documentation

## 2013-07-15 DIAGNOSIS — J45909 Unspecified asthma, uncomplicated: Secondary | ICD-10-CM | POA: Insufficient documentation

## 2013-07-15 DIAGNOSIS — H6690 Otitis media, unspecified, unspecified ear: Secondary | ICD-10-CM | POA: Insufficient documentation

## 2013-07-15 MED ORDER — AZITHROMYCIN 250 MG PO TABS: 250.0000 mg | ORAL_TABLET | Freq: Every day | ORAL | Status: DC

## 2013-07-15 NOTE — ED Notes (Signed)
Pt discharged.Vital signs stable and GCS 15 

## 2013-07-15 NOTE — ED Notes (Signed)
Pt c/o left ear pain that began yesterday. Pt states "I have an ear infection"

## 2013-07-15 NOTE — ED Provider Notes (Signed)
Medical screening examination/treatment/procedure(s) were performed by non-physician practitioner and as supervising physician I was immediately available for consultation/collaboration.   Dagmar Hait, MD 07/15/13 [phone]

## 2013-07-15 NOTE — ED Provider Notes (Signed)
CSN: [id number]     Arrival date & time 07/15/13  2000 History  This chart was scribed for non-physician practitioner, Irish Elders, NP working with Dagmar Hait, MD by Greggory Stallion, ED scribe. This patient was seen in room TR07C/TR07C and the patient's care was started at 9:37 PM.   Chief Complaint  Patient presents with  . Otalgia   The history is provided by the patient. No language interpreter was used.   HPI Comments: Angela Spencer is a 22 y.o. female who presents to the Emergency Department complaining of gradual onset, constant left otalgia that started yesterday. She states she can hear the fluid in her ear. Pt states she normally gets ear infections when the seasons change. She has not taken any medications for it. Pt denies fever, congestion and rhinorrhea. She denies any recent travel or being around anyone sick.   Past Medical History  Diagnosis Date  . Asthma   . Multiple allergies   . Migraine headache   . 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 Tonsillectomy    . Adenoidectomy    . Arm surgery      broken left arm at elbow   No family history on file. History  Substance Use Topics  . Smoking status: Current Every Day Smoker -- 0.50 packs/day  . Smokeless tobacco: Not on file  . Alcohol Use: No   OB History   Grav Para Term Preterm Abortions TAB SAB Ect Mult Living   3 2 2  1  1   1      Review of Systems  Constitutional: Negative for fever.  HENT: Positive for ear pain. Negative for congestion and rhinorrhea.   All other systems reviewed and are negative.    Allergies  Flexeril and Tramadol  Home Medications   Current Outpatient Rx  Name  Route  Sig  Dispense  Refill  . acetaminophen (TYLENOL) 325 MG tablet   Oral   Take 650 mg by mouth every 6 (six) hours as needed for pain.         Marland Kitchen ibuprofen (ADVIL,MOTRIN) 800 MG tablet   Oral   Take 1 tablet (800 mg total) by mouth  every 8 (eight) hours as needed for pain.   30 tablet   0   . Triamcinolone Acetonide (TRIAMCINOLONE 0.1 % CREAM : EUCERIN) CREA   Topical   Apply 1 application topically daily.          BP 122/94  Pulse 81  Temp(Src) 97.8 F (36.6 C) (Oral)  Resp 16  Wt 147 lb 14.4 oz (67.087 kg)  BMI 26.21 kg/m2  SpO2 98%  Physical Exam  Nursing note and vitals reviewed. Constitutional: She is oriented to person, place, and time. She appears well-developed and well-nourished. No distress.  HENT:  Head: Normocephalic and atraumatic.  Eyes: EOM are normal.  Neck: Neck supple. No tracheal deviation present.  Cardiovascular: Normal rate, regular rhythm and normal heart sounds.   Pulmonary/Chest: Effort normal. No respiratory distress. She has no wheezes. She has no rales.  Musculoskeletal: Normal range of motion.  Neurological: She is alert and oriented to person, place, and time.  Skin: Skin is warm and dry.  Psychiatric: She has a normal mood and affect. Her behavior is normal.    ED Course  Procedures (including critical care time)  DIAGNOSTIC STUDIES: Oxygen Saturation is 98% on RA, normal by my interpretation.  COORDINATION OF CARE: 9:39 PM-Discussed treatment plan which includes antibiotic and ibuprofen with pt at bedside and pt agreed to plan.   Labs Review Labs Reviewed - No data to display Imaging Review No results found.  MDM   1. Otitis media, left     Left otitis media. No fever or chills.   I personally performed the services described in this documentation, which was scribed in my presence. The recorded information has been reviewed and is accurate.   Irish Elders, NP 07/15/13 2236

## 2014-01-19 ENCOUNTER — Encounter (HOSPITAL_COMMUNITY): Payer: Self-pay | Admitting: Emergency Medicine

## 2014-01-19 ENCOUNTER — Emergency Department (HOSPITAL_COMMUNITY)
Admission: EM | Admit: 2014-01-19 | Discharge: 2014-01-19 | Discharge status: Against Medical Advice | Payer: Medicaid Other | Attending: Emergency Medicine | Admitting: Emergency Medicine

## 2014-01-19 DIAGNOSIS — R51 Headache: Secondary | ICD-10-CM | POA: Insufficient documentation

## 2014-01-19 DIAGNOSIS — F17200 Nicotine dependence, unspecified, uncomplicated: Secondary | ICD-10-CM | POA: Insufficient documentation

## 2014-01-19 DIAGNOSIS — Z8679 Personal history of other diseases of the circulatory system: Secondary | ICD-10-CM | POA: Insufficient documentation

## 2014-01-19 DIAGNOSIS — J45901 Unspecified asthma with (acute) exacerbation: Secondary | ICD-10-CM | POA: Insufficient documentation

## 2014-01-19 DIAGNOSIS — F411 Generalized anxiety disorder: Secondary | ICD-10-CM | POA: Insufficient documentation

## 2014-01-19 DIAGNOSIS — Z880 Allergy status to penicillin: Secondary | ICD-10-CM | POA: Insufficient documentation

## 2014-01-19 DIAGNOSIS — Z792 Long term (current) use of antibiotics: Secondary | ICD-10-CM | POA: Insufficient documentation

## 2014-01-19 DIAGNOSIS — IMO0002 Reserved for concepts with insufficient information to code with codable children: Secondary | ICD-10-CM | POA: Insufficient documentation

## 2014-01-19 NOTE — ED Notes (Signed)
Pt states that she leaving and walked out of ER

## 2014-01-19 NOTE — ED Notes (Signed)
Pt states dificulty breathing x 4 days. Cough x 1 week. C/o  HA staes head pain is all over. Pt highly anxious, and agitated. O2 saturation 93%

## 2014-01-19 NOTE — ED Notes (Signed)
Pt speaking in full sentences

## 2014-01-20 ENCOUNTER — Emergency Department (HOSPITAL_BASED_OUTPATIENT_CLINIC_OR_DEPARTMENT_OTHER)
Admission: EM | Admit: 2014-01-20 | Discharge: 2014-01-20 | Disposition: A | Discharge status: Home / Self Care | Payer: Medicaid Other | Attending: Emergency Medicine | Admitting: Emergency Medicine

## 2014-01-20 ENCOUNTER — Encounter (HOSPITAL_BASED_OUTPATIENT_CLINIC_OR_DEPARTMENT_OTHER): Payer: Self-pay | Admitting: Emergency Medicine

## 2014-01-20 ENCOUNTER — Emergency Department (HOSPITAL_BASED_OUTPATIENT_CLINIC_OR_DEPARTMENT_OTHER): Payer: Medicaid Other

## 2014-01-20 DIAGNOSIS — J45901 Unspecified asthma with (acute) exacerbation: Secondary | ICD-10-CM

## 2014-01-20 MED ORDER — DEXAMETHASONE SODIUM PHOSPHATE 10 MG/ML IJ SOLN
10.0000 mg | Freq: Once | INTRAMUSCULAR | Status: AC
  Administered 2014-01-20: 10 mg via INTRAMUSCULAR
  Filled 2014-01-20: qty 1

## 2014-01-20 MED ORDER — BENZONATATE 100 MG PO CAPS: 100.0000 mg | ORAL_CAPSULE | Freq: Three times a day (TID) | ORAL | Status: DC

## 2014-01-20 MED ORDER — ALBUTEROL SULFATE HFA 108 (90 BASE) MCG/ACT IN AERS
1.0000 | INHALATION_SPRAY | Freq: Four times a day (QID) | RESPIRATORY_TRACT | Status: DC | PRN
Start: 2014-01-20 — End: 2022-08-02

## 2014-01-20 MED ORDER — PREDNISONE 20 MG PO TABS: ORAL_TABLET | ORAL | Status: DC

## 2014-01-20 MED ORDER — FLUTICASONE PROPIONATE HFA 110 MCG/ACT IN AERO: 2.0000 | INHALATION_SPRAY | Freq: Two times a day (BID) | RESPIRATORY_TRACT | Status: DC

## 2014-01-20 MED ORDER — ALBUTEROL SULFATE (2.5 MG/3ML) 0.083% IN NEBU
5.0000 mg | INHALATION_SOLUTION | Freq: Once | RESPIRATORY_TRACT | Status: AC
  Administered 2014-01-20: 5 mg via RESPIRATORY_TRACT
  Filled 2014-01-20: qty 6

## 2014-01-20 NOTE — Discharge Instructions (Signed)
Bronchospasm, Adult A bronchospasm is a spasm or tightening of the airways going into the lungs. During a bronchospasm breathing becomes more difficult because the airways get smaller. When this happens there can be coughing, a whistling sound when breathing (wheezing), and difficulty breathing. Bronchospasm is often associated with asthma, but not all patients who experience a bronchospasm have asthma. CAUSES  A bronchospasm is caused by inflammation or irritation of the airways. The inflammation or irritation may be triggered by:   Allergies (such as to animals, pollen, food, or mold). Allergens that cause bronchospasm may cause wheezing immediately after exposure or many hours later.   Infection. Viral infections are believed to be the most common cause of bronchospasm.   Exercise.   Irritants (such as pollution, cigarette smoke, strong odors, aerosol sprays, and paint fumes).   Weather changes. Winds increase molds and pollens in the air. Rain refreshes the air by washing irritants out. Cold air may cause inflammation.   Stress and emotional upset.  SIGNS AND SYMPTOMS   Wheezing.   Excessive nighttime coughing.   Frequent or severe coughing with a simple cold.   Chest tightness.   Shortness of breath.  DIAGNOSIS  Bronchospasm is usually diagnosed through a history and physical exam. Tests, such as chest X-rays, are sometimes done to look for other conditions. TREATMENT   Inhaled medicines can be given to open up your airways and help you breathe. The medicines can be given using either an inhaler or a nebulizer machine.  Corticosteroid medicines may be given for severe bronchospasm, usually when it is associated with asthma. HOME CARE INSTRUCTIONS   Always have a plan prepared for seeking medical care. Know when to call your health care provider and local emergency services (911 in the [place] Know where you can access local emergency care.  Only take medicines as  directed by your health care provider.  If you were prescribed an inhaler or nebulizer machine, ask your health care provider to explain how to use it correctly. Always use a spacer with your inhaler if you were given one.  It is necessary to remain calm during an attack. Try to relax and breathe more slowly.  Control your home environment in the following ways:   Change your heating and air conditioning filter at least once a month.   Limit your use of fireplaces and wood stoves.  Do not smoke and do not allow smoking in your home.   Avoid exposure to perfumes and fragrances.   Get rid of pests (such as roaches and mice) and their droppings.   Throw away plants if you see mold on them.   Keep your house clean and dust free.   Replace carpet with wood, tile, or vinyl flooring. Carpet can trap dander and dust.   Use allergy-proof pillows, mattress covers, and box spring covers.   Wash bed sheets and blankets every week in hot water and dry them in a dryer.   Use blankets that are made of polyester or cotton.   Wash hands frequently. SEEK MEDICAL CARE IF:   You have muscle aches.   You have chest pain.   The sputum changes from clear or white to yellow, green, gray, or bloody.   The sputum you cough up gets thicker.   There are problems that may be related to the medicine you are given, such as a rash, itching, swelling, or trouble breathing.  SEEK IMMEDIATE MEDICAL CARE IF:   You have worsening wheezing and coughing  even after taking your prescribed medicines.   You have increased difficulty breathing.   You develop severe chest pain. MAKE SURE YOU:   Understand these instructions.  Will watch your condition.  Will get help right away if you are not doing well or get worse. Document Released: 09/30/2003 Document Revised: 05/30/2013 Document Reviewed: 03/19/2013 [facility] Patient Information 2014 [place], [place].  Asthma, Adult Asthma is a  condition of the lungs in which the airways tighten and narrow. Asthma can make it hard to breathe. Asthma cannot be cured, but medicine and lifestyle changes can help control it. Asthma may be started (triggered) by:  Animal skin flakes (dander).  Dust.  Cockroaches.  Pollen.  Mold.  Smoke.  Cleaning products.  Hair sprays or aerosol sprays.  Paint fumes or strong smells.  Cold air, weather changes, and winds.  Crying or laughing hard.  Stress.  Certain medicines or drugs.  Foods, such as dried fruit, potato chips, and sparkling grape juice.  Infections or conditions (colds, flu).  Exercise.  Certain medical conditions or diseases.  Exercise or tiring activities. HOME CARE   Take medicine as told by your doctor.  Use a peak flow meter as told by your doctor. A peak flow meter is a tool that measures how well the lungs are working.  Record and keep track of the peak flow meter's readings.  Understand and use the asthma action plan. An asthma action plan is a written plan for taking care of your asthma and treating your attacks.  To help prevent asthma attacks:  Do not smoke. Stay away from secondhand smoke.  Change your heating and air conditioning filter often.  Limit your use of fireplaces and wood stoves.  Get rid of pests (such as roaches and mice) and their droppings.  Throw away plants if you see mold on them.  Clean your floors. Dust regularly. Use cleaning products that do not smell.  Have someone vacuum when you are not home. Use a vacuum cleaner with a HEPA filter if possible.  Replace carpet with wood, tile, or vinyl flooring. Carpet can trap animal skin flakes and dust.  Use allergy-proof pillows, mattress covers, and box spring covers.  Wash bed sheets and blankets every week in hot water and dry them in a dryer.  Use blankets that are made of polyester or cotton.  Clean bathrooms and kitchens with bleach. If possible, have someone  repaint the walls in these rooms with mold-resistant paint. Keep out of the rooms that are being cleaned and painted.  Wash hands often. GET HELP IF:  You have make a whistling sound when breaking (wheeze), have shortness of breath, or have a cough even if taking medicine to prevent attacks.  The colored mucus you cough up (sputum) is thicker than usual.  The colored mucus you cough up changes from clear or white to yellow, green, gray, or bloody.  You have problems from the medicine you are taking such as:  A rash.  Itching.  Swelling.  Trouble breathing.  You need reliever medicines more than 2 3 times a week.  Your peak flow measurement is still at 50 79% of your personal best after following the action plan for 1 hour. GET HELP RIGHT AWAY IF:   You seem to be worse and are not responding to medicine during an asthma attack.  You are short of breath even at rest.  You get short of breath when doing very little activity.  You have trouble eating,  drinking, or talking.  You have chest pain.  You have a fast heartbeat.  Your lips or fingernails start to turn blue.  You are lightheaded, dizzy, or faint.  Your peak flow is less than 50% of your personal best.  You have a fever or lasting symptoms for more than 2 3 days.  You have a fever and your symptoms suddenly get worse. MAKE SURE YOU:   Understand these instructions.  Will watch your condition.  Will get help right away if you are not doing well or get worse. Document Released: 03/15/2008 Document Revised: 07/18/2013 Document Reviewed: 04/26/2013 [facility] Patient Information 2014 [place], [place].

## 2014-01-20 NOTE — ED Provider Notes (Signed)
CSN: [id number]     Arrival date & time 01/19/14  2359 History  This chart was scribed for No att. providers found by Bronson CurbJacqueline Melvin, ED Scribe. This patient was seen in room Room/bed info not found and the patient's care was started at 12:09 AM.   Chief Complaint  Patient presents with  . Shortness of Breath      Patient is a 23 y.o. female presenting with wheezing. The history is provided by the patient. No language interpreter was used.  Wheezing Severity:  Moderate Severity compared to prior episodes:  Similar Onset quality:  Gradual Duration:  4 days Timing:  Constant Progression:  Unchanged Chronicity:  Recurrent Context: exposure to allergen and smoke exposure   Context comment:  Patient smokes Relieved by:  Nothing Worsened by:  Nothing tried Ineffective treatments:  Nebulizer treatments Associated symptoms: no chest pain, no fever, no headaches and no rash   Risk factors: not exposed to toxic fumes     HPI Comments: Angela Spencer is a 23 y.o. female with a history of asthma who presents to the Emergency Department complaining of shortness of breath. She uses a nebulizer. She smokes everyday. She only uses nebulizer in the spring. For the past 4 days she lost her voice. Her cough is nonproductive.  States she hurts all over from coughing and wheezing.  No CP no leg swelling or pain.  No long car trips or plane trips.  Not using her nebulizer   Past Medical History  Diagnosis Date  . Asthma   . Multiple allergies   . Migraine headache   . 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 Tonsillectomy    . Adenoidectomy    . Arm surgery      broken left arm at elbow   No family history on file. History  Substance Use Topics  . Smoking status: Current Every Day Smoker -- 0.50 packs/day  . Smokeless tobacco: Not on file  . Alcohol Use: Yes     Comment: "barely"   OB History   Grav Para Term Preterm  Abortions TAB SAB Ect Mult Living   3 2 2  1  1   1      Review of Systems  Constitutional: Negative for fever.  Eyes: Negative for photophobia.  Respiratory: Positive for wheezing.   Cardiovascular: Negative for chest pain, palpitations and leg swelling.  Musculoskeletal: Negative for neck pain and neck stiffness.  Skin: Negative for rash.       States her whole body hurts from wheezing and coughing  Neurological: Negative for facial asymmetry, speech difficulty, light-headedness, numbness and headaches.  All other systems reviewed and are negative.     Allergies  Flexeril; Penicillins; and Tramadol  Home Medications   Current Outpatient Rx  Name  Route  Sig  Dispense  Refill  . azithromycin (ZITHROMAX) 250 MG tablet   Oral   Take 1 tablet (250 mg total) by mouth daily. Take first 2 tablets together, then 1 every day until finished.   6 tablet   0    LMP 01/06/2014 Physical Exam  Nursing note and vitals reviewed. Constitutional: She is oriented to person, place, and time. She appears well-developed and well-nourished.  HENT:  Head: Normocephalic and atraumatic.  Mouth/Throat: Oropharynx is clear and moist. No oropharyngeal exudate.  Eyes: Conjunctivae and EOM are normal. Pupils are equal, round, and reactive to light.  Neck:  Normal range of motion.  Cardiovascular: Normal rate, regular rhythm and intact distal pulses.   Pulmonary/Chest: No stridor. She has no wheezes. She has no rales. She exhibits no tenderness.  Diminshed  Abdominal: Soft. Bowel sounds are normal. There is no tenderness. There is no rebound and no guarding.  Musculoskeletal: Normal range of motion.  Lymphadenopathy:    She has no cervical adenopathy.  Neurological: She is alert and oriented to person, place, and time.  Skin: Skin is warm and dry.  Psychiatric: Her mood appears anxious.    ED Course  Procedures (including critical care time) DIAGNOSTIC STUDIES: Oxygen Saturation is 97% on RA,  adequate by my interpretation.    COORDINATION OF CARE: 12:09 AM- Pt advised of plan for treatment and pt agrees.   Labs Review Labs Reviewed - No data to display Imaging Review No results found.   EKG Interpretation None      MDM  PERC negative [place] 0 Final diagnoses:  None    Wheezing on arrival, now wheezing resolved and tight.  Symptoms consistent with asthma is still smoking and not using her meds. Will prescribe inhaler and steroids.  Tessalon Perles  I personally performed the services described in this documentation, which was scribed in my presence. The recorded information has been reviewed and is accurate.    Jasmine Awe, MD 01/20/14 [phone]

## 2014-01-20 NOTE — ED Notes (Signed)
Cough and sob x 4 days- states her neb machine is broken

## 2014-03-01 ENCOUNTER — Emergency Department (HOSPITAL_COMMUNITY): Payer: No Typology Code available for payment source

## 2014-03-01 ENCOUNTER — Emergency Department (HOSPITAL_COMMUNITY)
Admission: EM | Admit: 2014-03-01 | Discharge: 2014-03-01 | Disposition: A | Discharge status: Home / Self Care | Payer: No Typology Code available for payment source | Attending: Emergency Medicine | Admitting: Emergency Medicine

## 2014-03-01 ENCOUNTER — Encounter (HOSPITAL_COMMUNITY): Payer: Self-pay | Admitting: Emergency Medicine

## 2014-03-01 DIAGNOSIS — F17200 Nicotine dependence, unspecified, uncomplicated: Secondary | ICD-10-CM | POA: Insufficient documentation

## 2014-03-01 DIAGNOSIS — Z880 Allergy status to penicillin: Secondary | ICD-10-CM | POA: Diagnosis not present

## 2014-03-01 DIAGNOSIS — S0993XA Unspecified injury of face, initial encounter: Secondary | ICD-10-CM | POA: Insufficient documentation

## 2014-03-01 DIAGNOSIS — M549 Dorsalgia, unspecified: Secondary | ICD-10-CM

## 2014-03-01 DIAGNOSIS — Z8679 Personal history of other diseases of the circulatory system: Secondary | ICD-10-CM | POA: Insufficient documentation

## 2014-03-01 DIAGNOSIS — S199XXA Unspecified injury of neck, initial encounter: Secondary | ICD-10-CM | POA: Insufficient documentation

## 2014-03-01 DIAGNOSIS — IMO0002 Reserved for concepts with insufficient information to code with codable children: Secondary | ICD-10-CM | POA: Diagnosis not present

## 2014-03-01 DIAGNOSIS — Y92410 Unspecified street and highway as the place of occurrence of the external cause: Secondary | ICD-10-CM | POA: Diagnosis not present

## 2014-03-01 DIAGNOSIS — Y9389 Activity, other specified: Secondary | ICD-10-CM | POA: Diagnosis not present

## 2014-03-01 DIAGNOSIS — M542 Cervicalgia: Secondary | ICD-10-CM

## 2014-03-01 DIAGNOSIS — J45909 Unspecified asthma, uncomplicated: Secondary | ICD-10-CM | POA: Diagnosis not present

## 2014-03-01 DIAGNOSIS — Z79899 Other long term (current) drug therapy: Secondary | ICD-10-CM | POA: Insufficient documentation

## 2014-03-01 MED ORDER — IBUPROFEN 600 MG PO TABS: 600.0000 mg | ORAL_TABLET | Freq: Four times a day (QID) | ORAL | Status: DC | PRN

## 2014-03-01 MED ORDER — IBUPROFEN 800 MG PO TABS
800.0000 mg | ORAL_TABLET | Freq: Once | ORAL | Status: AC
  Administered 2014-03-01: 800 mg via ORAL
  Filled 2014-03-01: qty 1

## 2014-03-01 MED ORDER — OXYCODONE-ACETAMINOPHEN 5-325 MG PO TABS: 2.0000 | ORAL_TABLET | ORAL | Status: DC | PRN

## 2014-03-01 MED ORDER — METHOCARBAMOL 500 MG PO TABS: 500.0000 mg | ORAL_TABLET | Freq: Two times a day (BID) | ORAL | Status: DC

## 2014-03-01 MED ORDER — METHOCARBAMOL 500 MG PO TABS
500.0000 mg | ORAL_TABLET | Freq: Once | ORAL | Status: AC
  Administered 2014-03-01: 500 mg via ORAL
  Filled 2014-03-01: qty 1

## 2014-03-01 MED ORDER — OXYCODONE-ACETAMINOPHEN 5-325 MG PO TABS
1.0000 | ORAL_TABLET | Freq: Once | ORAL | Status: AC
  Administered 2014-03-01: 1 via ORAL
  Filled 2014-03-01: qty 1
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
## 2014-04-02 IMAGING — CT CT HEAD W/O CM
1 series · 16 of 30 positions shown, 20 images · non-contrast
Comparison: [DATE]

CLINICAL DATA: Headache, dizziness

CT HEAD WITHOUT CONTRAST
TECHNIQUE: Contiguous axial images were obtained from the base of
the skull through the vertex without contrast.

[Series 2: (id) head 4.8 h37s st · axial · 0.49mm/px · z∈[-217,-82]mm · 16 of 30 slices shown, 20 images]
[im 2/30  brain]
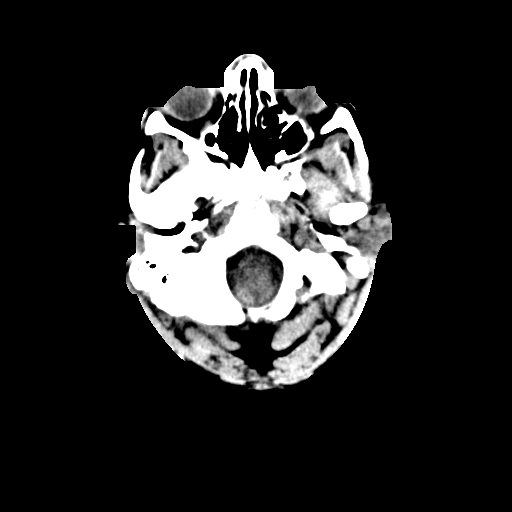
[im 2/30  bone]
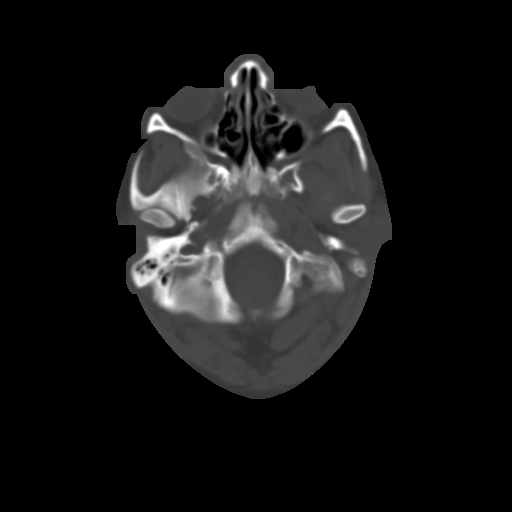
[im 4/30  brain]
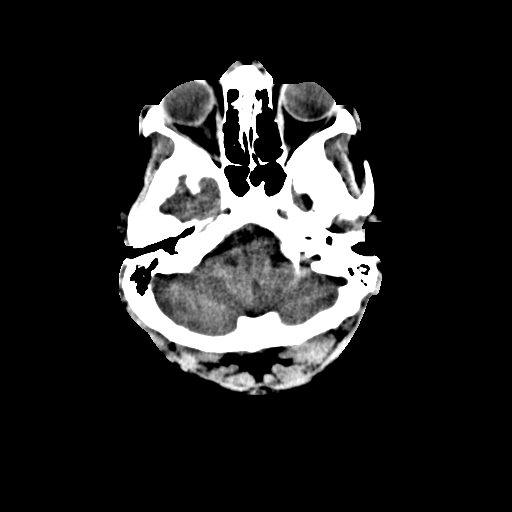
[im 6/30  brain]
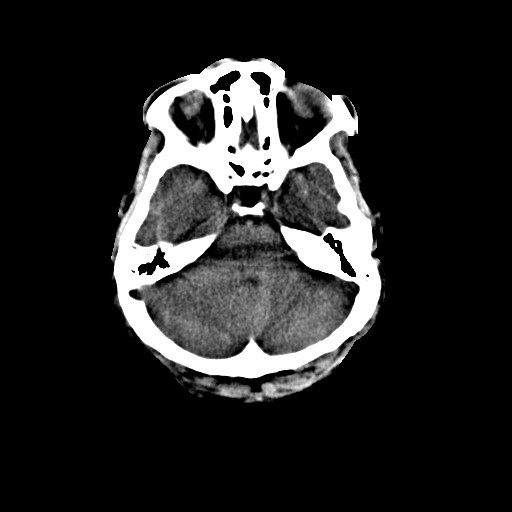
[im 8/30  brain]
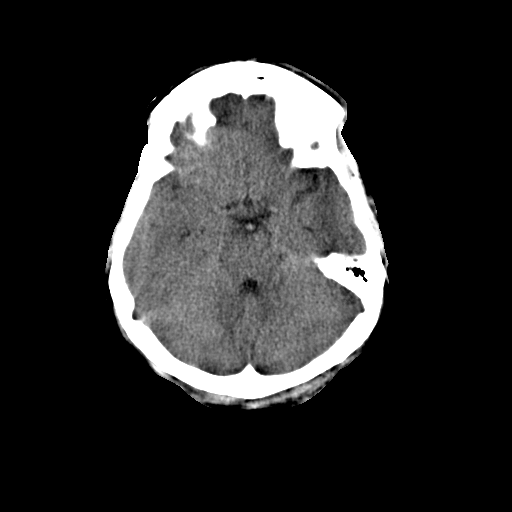
[im 9/30  brain]
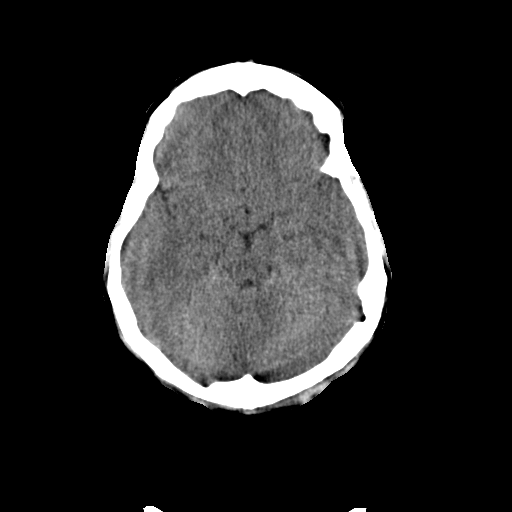
[im 9/30  bone]
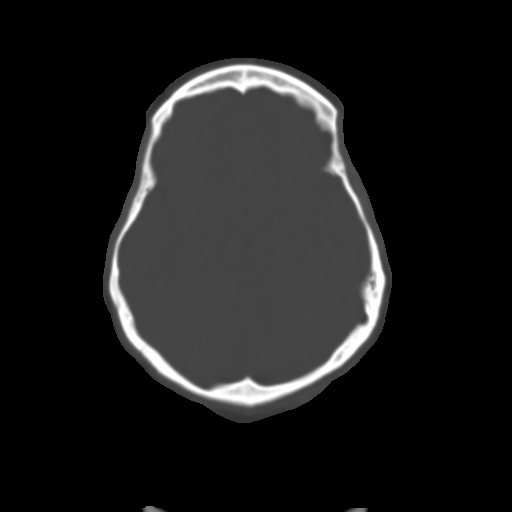
[im 11/30  brain]
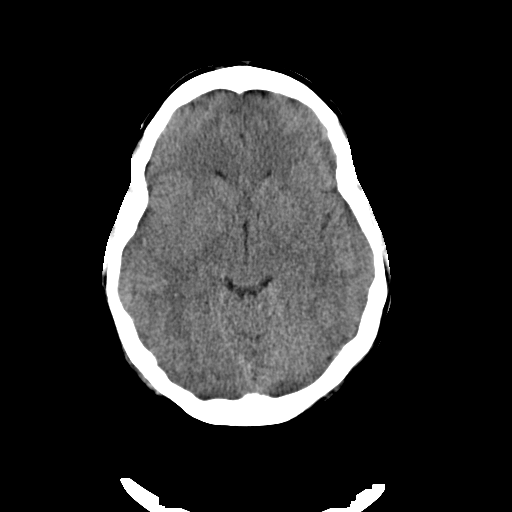
[im 13/30  brain]
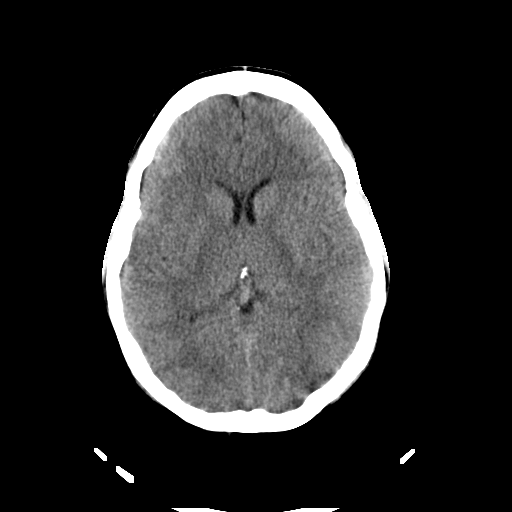
[im 15/30  brain]
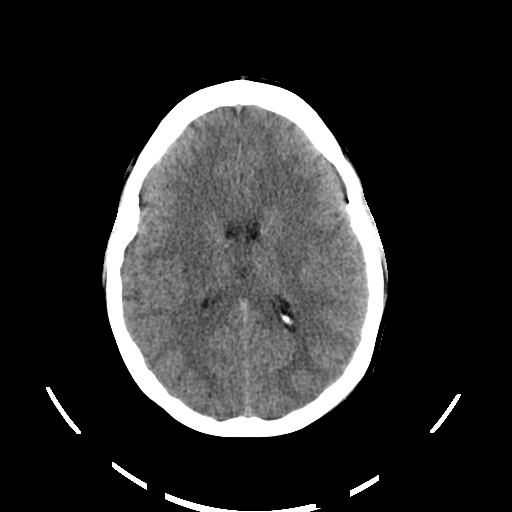
[im 16/30  brain]
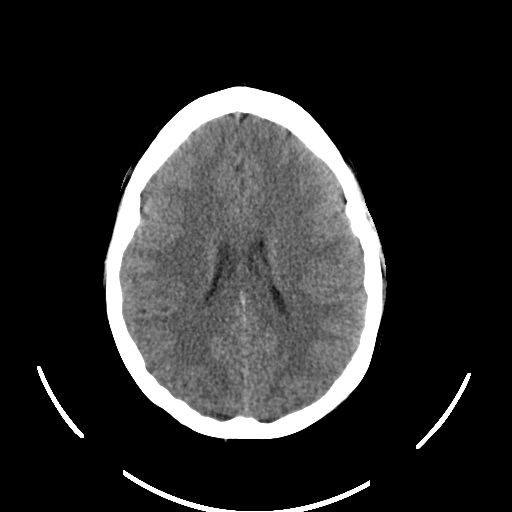
[im 16/30  bone]
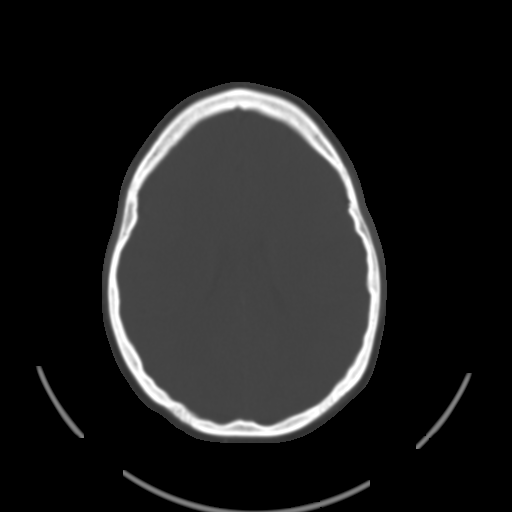
[im 18/30  brain]
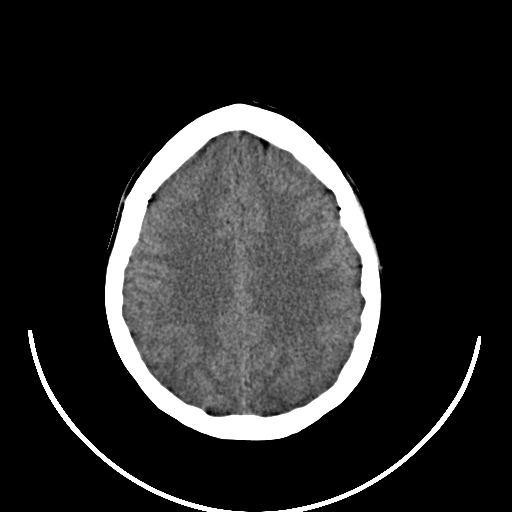
[im 20/30  brain]
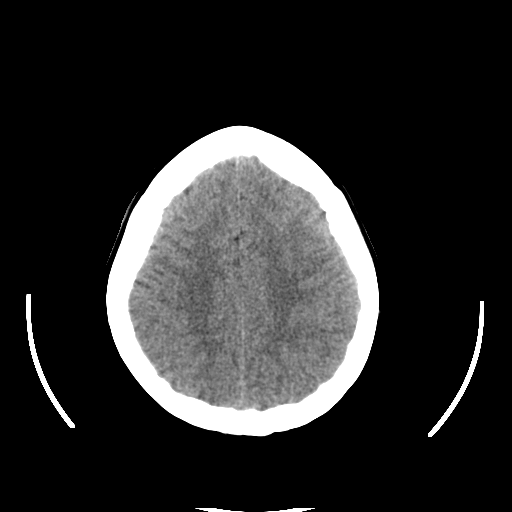
[im 22/30  brain]
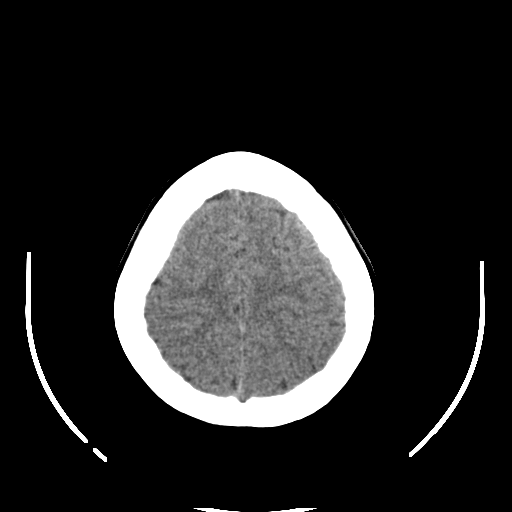
[im 23/30  brain]
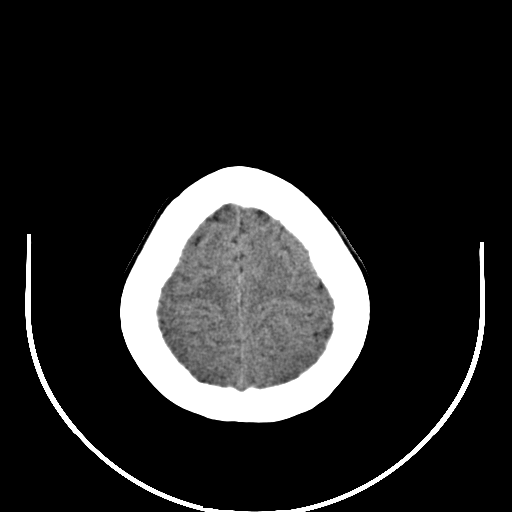
[im 23/30  bone]
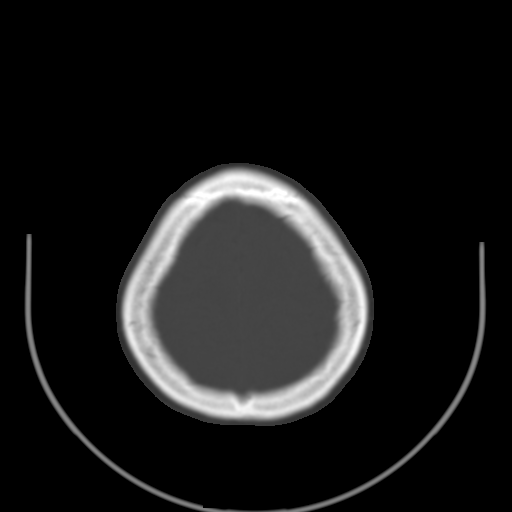
[im 25/30  brain]
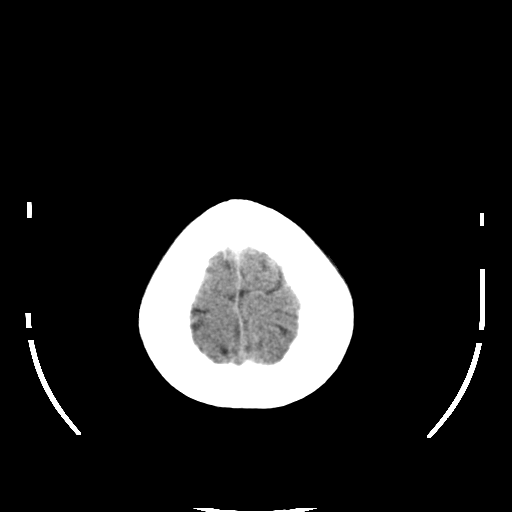
[im 27/30  brain]
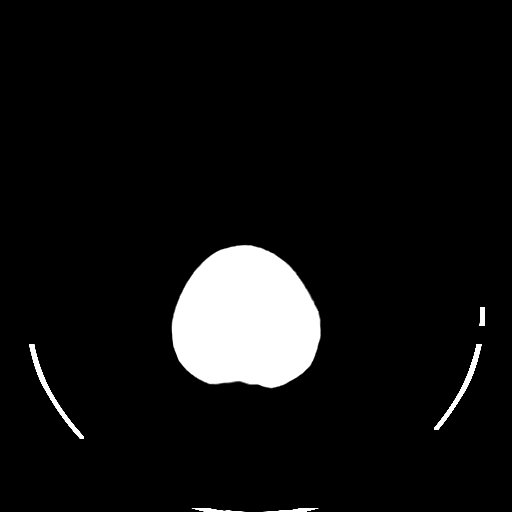
[im 29/30  brain]
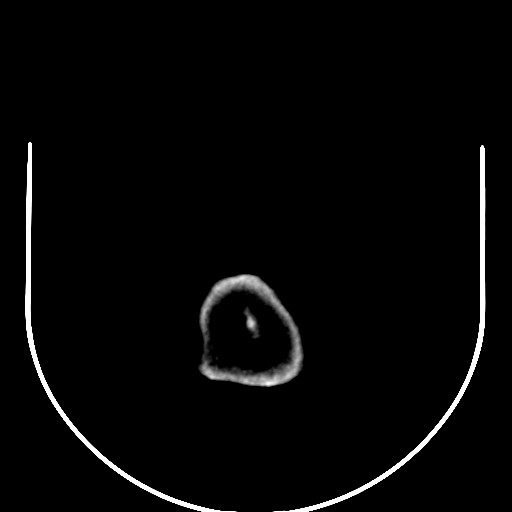

[16 of 30 positions shown; findings below may reference images not displayed]

FINDINGS: No skull fracture is noted.  Paranasal sinuses and
mastoid air cells are unremarkable.  No intracranial hemorrhage,
mass effect or midline shift.  No hydrocephalus.  The gray and
white matter differentiation is preserved.

No acute infarction.  No mass lesion is noted on this unenhanced
scan.  No hydrocephalus.
IMPRESSION: No acute intracranial abnormality.  No significant change.

## 2014-06-21 ENCOUNTER — Emergency Department (HOSPITAL_COMMUNITY): Payer: Medicaid Other

## 2014-06-21 ENCOUNTER — Encounter (HOSPITAL_COMMUNITY): Payer: Self-pay | Admitting: Emergency Medicine

## 2014-06-21 ENCOUNTER — Emergency Department (HOSPITAL_COMMUNITY)
Admission: EM | Admit: 2014-06-21 | Discharge: 2014-06-21 | Disposition: A | Discharge status: Home / Self Care | Payer: Medicaid Other | Attending: Emergency Medicine | Admitting: Emergency Medicine

## 2014-06-21 DIAGNOSIS — Z3202 Encounter for pregnancy test, result negative: Secondary | ICD-10-CM | POA: Diagnosis not present

## 2014-06-21 DIAGNOSIS — S5002XA Contusion of left elbow, initial encounter: Secondary | ICD-10-CM

## 2014-06-21 DIAGNOSIS — Z79899 Other long term (current) drug therapy: Secondary | ICD-10-CM | POA: Insufficient documentation

## 2014-06-21 DIAGNOSIS — S0993XA Unspecified injury of face, initial encounter: Secondary | ICD-10-CM | POA: Diagnosis present

## 2014-06-21 DIAGNOSIS — S3981XA Other specified injuries of abdomen, initial encounter: Secondary | ICD-10-CM | POA: Diagnosis not present

## 2014-06-21 DIAGNOSIS — S5000XA Contusion of unspecified elbow, initial encounter: Secondary | ICD-10-CM | POA: Diagnosis not present

## 2014-06-21 DIAGNOSIS — S199XXA Unspecified injury of neck, initial encounter: Secondary | ICD-10-CM | POA: Insufficient documentation

## 2014-06-21 DIAGNOSIS — S8000XA Contusion of unspecified knee, initial encounter: Secondary | ICD-10-CM | POA: Diagnosis not present

## 2014-06-21 DIAGNOSIS — S1093XA Contusion of unspecified part of neck, initial encounter: Principal | ICD-10-CM

## 2014-06-21 DIAGNOSIS — Y9389 Activity, other specified: Secondary | ICD-10-CM | POA: Diagnosis not present

## 2014-06-21 DIAGNOSIS — J45909 Unspecified asthma, uncomplicated: Secondary | ICD-10-CM | POA: Diagnosis not present

## 2014-06-21 DIAGNOSIS — Z8679 Personal history of other diseases of the circulatory system: Secondary | ICD-10-CM | POA: Insufficient documentation

## 2014-06-21 DIAGNOSIS — F17200 Nicotine dependence, unspecified, uncomplicated: Secondary | ICD-10-CM | POA: Insufficient documentation

## 2014-06-21 DIAGNOSIS — Z880 Allergy status to penicillin: Secondary | ICD-10-CM | POA: Diagnosis not present

## 2014-06-21 DIAGNOSIS — Y92410 Unspecified street and highway as the place of occurrence of the external cause: Secondary | ICD-10-CM | POA: Diagnosis not present

## 2014-06-21 DIAGNOSIS — S0083XA Contusion of other part of head, initial encounter: Secondary | ICD-10-CM | POA: Insufficient documentation

## 2014-06-21 DIAGNOSIS — R109 Unspecified abdominal pain: Secondary | ICD-10-CM

## 2014-06-21 DIAGNOSIS — S0003XA Contusion of scalp, initial encounter: Secondary | ICD-10-CM | POA: Diagnosis not present

## 2014-06-21 DIAGNOSIS — S8002XA Contusion of left knee, initial encounter: Secondary | ICD-10-CM

## 2014-06-21 LAB — COMPREHENSIVE METABOLIC PANEL
ALT: 6 U/L (ref 0–35)
AST: 13 U/L (ref 0–37)
Albumin: 4.1 g/dL (ref 3.5–5.2)
Alkaline Phosphatase: 68 U/L (ref 39–117)
Anion gap: 13 (ref 5–15)
BUN: <3 mg/dL — ABNORMAL LOW (ref 6–23)
CO2: 24 mEq/L (ref 19–32)
Calcium: 9.4 mg/dL (ref 8.4–10.5)
Chloride: 102 mEq/L (ref 96–112)
Creatinine, Ser: 0.72 mg/dL (ref 0.50–1.10)
GFR calc Af Amer: >90 mL/min (ref >90)
GFR calc non Af Amer: >90 mL/min (ref >90)
Glucose, Bld: 101 mg/dL — ABNORMAL HIGH (ref 70–99)
Potassium: 3.7 mEq/L (ref 3.7–5.3)
Sodium: 139 mEq/L (ref 137–147)
Total Bilirubin: 0.4 mg/dL (ref 0.3–1.2)
Total Protein: 7.9 g/dL (ref 6.0–8.3)

## 2014-06-21 LAB — CBC WITH DIFFERENTIAL/PLATELET
Basophils Absolute: 0 10*3/uL (ref 0.0–0.1)
Basophils Relative: 0 % (ref 0–1)
Eosinophils Absolute: 0 10*3/uL (ref 0.0–0.7)
Eosinophils Relative: 0 % (ref 0–5)
HCT: 40.1 % (ref 36.0–46.0)
Hemoglobin: 14.2 g/dL (ref 12.0–15.0)
Lymphocytes Relative: 17 % (ref 12–46)
Lymphs Abs: 1.7 10*3/uL (ref 0.7–4.0)
MCH: 29.8 pg (ref 26.0–34.0)
MCHC: 35.4 g/dL (ref 30.0–36.0)
MCV: 84.2 fL (ref 78.0–100.0)
Monocytes Absolute: 1.1 10*3/uL — ABNORMAL HIGH (ref 0.1–1.0)
Monocytes Relative: 11 % (ref 3–12)
Neutro Abs: 7.5 10*3/uL (ref 1.7–7.7)
Neutrophils Relative %: 72 % (ref 43–77)
Platelets: 325 10*3/uL (ref 150–400)
RBC: 4.76 MIL/uL (ref 3.87–5.11)
RDW: 12.4 % (ref 11.5–15.5)
WBC: 10.3 10*3/uL (ref 4.0–10.5)

## 2014-06-21 LAB — URINALYSIS, ROUTINE W REFLEX MICROSCOPIC
Bilirubin Urine: NEGATIVE
Glucose, UA: NEGATIVE mg/dL
Hgb urine dipstick: NEGATIVE
Ketones, ur: NEGATIVE mg/dL
Leukocytes, UA: NEGATIVE
Nitrite: NEGATIVE
Protein, ur: NEGATIVE mg/dL
Specific Gravity, Urine: 1.006 (ref 1.005–1.030)
Urobilinogen, UA: 0.2 mg/dL (ref 0.0–1.0)
pH: 7 (ref 5.0–8.0)

## 2014-06-21 LAB — RAPID URINE DRUG SCREEN, HOSP PERFORMED
Amphetamines: POSITIVE — AB
Barbiturates: NOT DETECTED
Benzodiazepines: NOT DETECTED
Cocaine: NOT DETECTED
Opiates: NOT DETECTED
Tetrahydrocannabinol: POSITIVE — AB

## 2014-06-21 LAB — ETHANOL: Alcohol, Ethyl (B): <11 mg/dL (ref 0–11)

## 2014-06-21 LAB — PREGNANCY, URINE: Preg Test, Ur: NEGATIVE

## 2014-06-21 MED ORDER — IOHEXOL 300 MG/ML  SOLN
100.0000 mL | Freq: Once | INTRAMUSCULAR | Status: AC | PRN
  Administered 2014-06-21: 100 mL via INTRAVENOUS

## 2014-06-21 MED ORDER — METHOCARBAMOL 500 MG PO TABS: 1000.0000 mg | ORAL_TABLET | Freq: Four times a day (QID) | ORAL | Status: DC | PRN

## 2014-06-21 MED ORDER — ONDANSETRON HCL 4 MG/2ML IJ SOLN
4.0000 mg | Freq: Once | INTRAMUSCULAR | Status: AC
  Administered 2014-06-21: 4 mg via INTRAVENOUS
  Filled 2014-06-21: qty 2

## 2014-06-21 MED ORDER — FENTANYL CITRATE 0.05 MG/ML IJ SOLN
25.0000 ug | Freq: Once | INTRAMUSCULAR | Status: AC
  Administered 2014-06-21: 25 ug via INTRAVENOUS
  Filled 2014-06-21: qty 2

## 2014-06-21 MED ORDER — NAPROXEN SODIUM 550 MG PO TABS: 550.0000 mg | ORAL_TABLET | Freq: Two times a day (BID) | ORAL | Status: DC

## 2014-06-21 MED ORDER — SODIUM CHLORIDE 0.9 % IV SOLN
INTRAVENOUS | Status: DC
  Administered 2014-06-21: 08:00:00 via INTRAVENOUS
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
## 2014-06-21 NOTE — Progress Notes (Signed)
CT awaiting Upreg and Creat as per Dr. Delford Field orders

## 2014-06-21 NOTE — ED Notes (Signed)
Patient in NAD at time of d/c 

## 2014-06-21 NOTE — Discharge Instructions (Signed)
Ice packs to the injured or sore muscles for the next several days then start using heat. Take the medications for pain and muscle spasms. Return to the ED for any problems listed on the head injury sheet. Recheck if you aren't improving in the next week. ° °

## 2014-06-21 NOTE — ED Notes (Addendum)
Pt Placed in [place]. Hooked up to BP and Pulse Ox. Pt was told we need a Urine Sample, but does not need to go at this time.
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
## 2014-07-11 ENCOUNTER — Emergency Department (HOSPITAL_COMMUNITY)
Admission: EM | Admit: 2014-07-11 | Discharge: 2014-07-13 | Disposition: A | Discharge status: Home / Self Care | Payer: MEDICAID | Attending: Emergency Medicine | Admitting: Emergency Medicine

## 2014-07-11 ENCOUNTER — Ambulatory Visit (HOSPITAL_COMMUNITY)
Admission: RE | Admit: 2014-07-11 | Discharge: 2014-07-11 | Disposition: A | Discharge status: Home / Self Care | Payer: Self-pay | Attending: Psychiatry | Admitting: Psychiatry

## 2014-07-11 ENCOUNTER — Encounter (HOSPITAL_COMMUNITY): Payer: Self-pay | Admitting: Emergency Medicine

## 2014-07-11 DIAGNOSIS — Z8679 Personal history of other diseases of the circulatory system: Secondary | ICD-10-CM | POA: Insufficient documentation

## 2014-07-11 DIAGNOSIS — F22 Delusional disorders: Secondary | ICD-10-CM | POA: Diagnosis not present

## 2014-07-11 DIAGNOSIS — Z720 Tobacco use: Secondary | ICD-10-CM | POA: Insufficient documentation

## 2014-07-11 DIAGNOSIS — F23 Brief psychotic disorder: Secondary | ICD-10-CM

## 2014-07-11 DIAGNOSIS — Z3202 Encounter for pregnancy test, result negative: Secondary | ICD-10-CM | POA: Diagnosis not present

## 2014-07-11 DIAGNOSIS — J45909 Unspecified asthma, uncomplicated: Secondary | ICD-10-CM | POA: Diagnosis not present

## 2014-07-11 DIAGNOSIS — F29 Unspecified psychosis not due to a substance or known physiological condition: Secondary | ICD-10-CM | POA: Diagnosis present

## 2014-07-11 DIAGNOSIS — Z880 Allergy status to penicillin: Secondary | ICD-10-CM | POA: Diagnosis not present

## 2014-07-11 DIAGNOSIS — F419 Anxiety disorder, unspecified: Secondary | ICD-10-CM | POA: Diagnosis present

## 2014-07-11 DIAGNOSIS — Z8669 Personal history of other diseases of the nervous system and sense organs: Secondary | ICD-10-CM | POA: Insufficient documentation

## 2014-07-11 LAB — COMPREHENSIVE METABOLIC PANEL
ALT: 10 U/L (ref 0–35)
AST: 13 U/L (ref 0–37)
Albumin: 4.2 g/dL (ref 3.5–5.2)
Alkaline Phosphatase: 68 U/L (ref 39–117)
Anion gap: 16 — ABNORMAL HIGH (ref 5–15)
BUN: 8 mg/dL (ref 6–23)
CO2: 20 mEq/L (ref 19–32)
Calcium: 9.5 mg/dL (ref 8.4–10.5)
Chloride: 99 mEq/L (ref 96–112)
Creatinine, Ser: 0.76 mg/dL (ref 0.50–1.10)
GFR calc Af Amer: >90 mL/min (ref >90)
GFR calc non Af Amer: >90 mL/min (ref >90)
Glucose, Bld: 88 mg/dL (ref 70–99)
Potassium: 3.2 mEq/L — ABNORMAL LOW (ref 3.7–5.3)
Sodium: 135 mEq/L — ABNORMAL LOW (ref 137–147)
Total Bilirubin: 0.6 mg/dL (ref 0.3–1.2)
Total Protein: 8.2 g/dL (ref 6.0–8.3)

## 2014-07-11 LAB — CBC
HCT: 39.1 % (ref 36.0–46.0)
Hemoglobin: 13.7 g/dL (ref 12.0–15.0)
MCH: 29.2 pg (ref 26.0–34.0)
MCHC: 35 g/dL (ref 30.0–36.0)
MCV: 83.4 fL (ref 78.0–100.0)
Platelets: 366 10*3/uL (ref 150–400)
RBC: 4.69 MIL/uL (ref 3.87–5.11)
RDW: 12.4 % (ref 11.5–15.5)
WBC: 11.3 10*3/uL — ABNORMAL HIGH (ref 4.0–10.5)

## 2014-07-11 LAB — ETHANOL: Alcohol, Ethyl (B): <11 mg/dL (ref 0–11)

## 2014-07-11 MED ORDER — NICOTINE 21 MG/24HR TD PT24
21.0000 mg | MEDICATED_PATCH | Freq: Every day | TRANSDERMAL | Status: DC
  Administered 2014-07-11 – 2014-07-13 (×3): 21 mg via TRANSDERMAL
  Filled 2014-07-11 (×3): qty 1

## 2014-07-11 MED ORDER — LORAZEPAM 1 MG PO TABS
1.0000 mg | ORAL_TABLET | Freq: Three times a day (TID) | ORAL | Status: DC | PRN
Start: 2014-07-11 — End: 2014-07-13
  Administered 2014-07-11 – 2014-07-12 (×2): 1 mg via ORAL
  Filled 2014-07-11 (×2): qty 1

## 2014-07-11 MED ORDER — ONDANSETRON HCL 4 MG PO TABS: 4.0000 mg | ORAL_TABLET | Freq: Three times a day (TID) | ORAL | Status: DC | PRN

## 2014-07-11 MED ORDER — MORPHINE SULFATE 4 MG/ML IJ SOLN: 4.0000 mg | Freq: Once | INTRAMUSCULAR | Status: DC

## 2014-07-11 MED ORDER — ACETAMINOPHEN 325 MG PO TABS
650.0000 mg | ORAL_TABLET | ORAL | Status: DC | PRN
  Administered 2014-07-11 – 2014-07-12 (×2): 650 mg via ORAL
  Filled 2014-07-11 (×2): qty 2

## 2014-07-11 MED ORDER — ALUM & MAG HYDROXIDE-SIMETH 200-200-20 MG/5ML PO SUSP
30.0000 mL | ORAL | Status: DC | PRN
Start: 2014-07-11 — End: 2014-07-12

## 2014-07-11 MED ORDER — SODIUM CHLORIDE 0.9 % IV BOLUS (SEPSIS): 1000.0000 mL | Freq: Once | INTRAVENOUS | Status: DC

## 2014-07-11 MED ORDER — IBUPROFEN 200 MG PO TABS: 600.0000 mg | ORAL_TABLET | Freq: Three times a day (TID) | ORAL | Status: DC | PRN

## 2014-07-11 MED ORDER — ZOLPIDEM TARTRATE 5 MG PO TABS
5.0000 mg | ORAL_TABLET | Freq: Every evening | ORAL | Status: DC | PRN
  Administered 2014-07-12: 5 mg via ORAL
  Filled 2014-07-11: qty 1

## 2014-07-11 NOTE — ED Notes (Addendum)
Pt reports "smoking weed" an hour ago. Per sister pt woke up paranoid and frightened of everything. Upon assessment pt states "get where I can see you" and looks under everything others touch. Pt loud but not aggressive with staff. Sister reports other sister past away 10 years ago and pt had similar actions then.
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
## 2014-07-11 NOTE — ED Notes (Signed)
Pt requested food, pt given ginger ale, graham crackers and peanut butter.

## 2014-07-11 NOTE — BH Assessment (Signed)
[facility] Patient appears manic, paranoid at this time. Patient states "I am not crazy I am bipolar." Patient verbalizes "I smoked all my weed before I got here." Patients sister reports, she has been acting bizarre for two weeks. [facility] Report called to charge RN Candise BowensJen.

## 2014-07-11 NOTE — ED Notes (Signed)
Pt anxious and loud with staff. Pt not threatening or physical. Pt request only females to assist with care.

## 2014-07-11 NOTE — ED Notes (Signed)
[facility]

## 2014-07-11 NOTE — ED Notes (Signed)
Pt requesting to use phone to call mother. Pt unsuccessful at reaching mother. Sister sitting with pt at present time. Pt stating to sister "you better not leave me before mom gets here." Pt pacing back and forth in room, pt cooperative at present time.

## 2014-07-11 NOTE — ED Notes (Addendum)
Flip-flops, shirt, blue jeans, bra, and sunglasses in one pt belongings bag in [address].
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
## 2014-07-11 NOTE — ED Notes (Signed)
Per IVC paperwork, pt brought to psychiatrist by sister. Sister says she woke up this morning agitated and threatening to family members. Pt remains agitates upon arrival to emergency room.

## 2014-07-11 NOTE — ED Notes (Signed)
Pt attempt to provide urine sample. Pt reports "forgot to pee in cup." Will attempt urine sample at further. Pt watching tv at present time, comfortable, and calm.

## 2014-07-11 NOTE — BH Assessment (Signed)
[facility] Assessment Progress Note  This writer was asked to help facilitate involuntary commitment for this pt.  IVC paperwork was prepared along with Geoffery LyonsIrving Lugo, MD.  [place] [facility] [place], who confirmed receipt at 12:37.  Doylene Canninghomas Brunette Lavalle, MA Triage Specialist 07/11/2014 @ 12:57

## 2014-07-11 NOTE — ED Notes (Signed)
Angela Spencer/mother contact number [phone]

## 2014-07-12 ENCOUNTER — Inpatient Hospital Stay (HOSPITAL_COMMUNITY)
Admission: EM | Admit: 2014-07-12 | Discharge: 2014-07-12 | Disposition: A | Discharge status: Home / Self Care | Payer: Medicaid Other | Source: Intra-hospital | Attending: Psychiatry | Admitting: Psychiatry

## 2014-07-12 ENCOUNTER — Encounter (HOSPITAL_COMMUNITY): Payer: Self-pay | Admitting: Psychiatry

## 2014-07-12 DIAGNOSIS — F23 Brief psychotic disorder: Secondary | ICD-10-CM

## 2014-07-12 DIAGNOSIS — F419 Anxiety disorder, unspecified: Secondary | ICD-10-CM | POA: Diagnosis present

## 2014-07-12 MED ORDER — MAGNESIUM HYDROXIDE 400 MG/5ML PO SUSP: 30.0000 mL | Freq: Every day | ORAL | Status: DC | PRN

## 2014-07-12 MED ORDER — QUETIAPINE FUMARATE 25 MG PO TABS
25.0000 mg | ORAL_TABLET | Freq: Two times a day (BID) | ORAL | Status: DC
  Administered 2014-07-13: 25 mg via ORAL
  Filled 2014-07-12 (×2): qty 1

## 2014-07-12 MED ORDER — ALUM & MAG HYDROXIDE-SIMETH 200-200-20 MG/5ML PO SUSP: 30.0000 mL | ORAL | Status: DC | PRN

## 2014-07-12 MED ORDER — TRAZODONE HCL 50 MG PO TABS: 50.0000 mg | ORAL_TABLET | Freq: Every evening | ORAL | Status: DC | PRN

## 2014-07-12 MED ORDER — ACETAMINOPHEN 325 MG PO TABS: 650.0000 mg | ORAL_TABLET | Freq: Four times a day (QID) | ORAL | Status: DC | PRN

## 2014-07-12 MED ORDER — QUETIAPINE FUMARATE 50 MG PO TABS
50.0000 mg | ORAL_TABLET | Freq: Every day | ORAL | Status: DC
  Administered 2014-07-12: 50 mg via ORAL
  Filled 2014-07-12: qty 1

## 2014-07-12 NOTE — Progress Notes (Deleted)
[facility] [place] by Dr. Anice PaganiniKhokher. Pt to be transported under IVC. Pt to be transferred once room is available. [facility] to call with accepting time.   Byrd HesselbachKristen Deagen Krass, LCSW [phone]  ED CSW  07/12/2014 1214pm

## 2014-07-12 NOTE — Progress Notes (Signed)
  CARE MANAGEMENT ED NOTE 07/12/2014  Patient:  Angela Spencer,Angela Spencer   Account Number:  [id number]  Date Initiated:  07/12/2014  Documentation initiated by:  Edd ArbourGIBBS,KIMBERLY  Subjective/Objective Assessment:   23 yr old self pay [facility] pt Per IVC paperwork, pt brought to psychiatrist by sister. Sister says she woke up this morning agitated and threatening to family members     Subjective/Objective Assessment Detail:   no pcp per EPIC    dx Anxiety Disorder NOS and Psychotic Disorder NOS     Action/Plan:   spoke with pt offered self pay resources for [place]   Action/Plan Detail:   Anticipated DC Date:       Status Recommendation to Physician:   Result of Recommendation:    Other ED Services  Consult Working Plan    DC Associate Professorlanning Services  Other  Outpatient Services - Pt will follow up  PCP issues    Choice offered to / List presented to:            Status of service:  Completed, signed off  ED Comments:   ED Comments Detail:  [facility] Location[place], [place] [id number] Contact Phone: [phone] Services: Basic medical, dental (extractions, fillings, x-rays, occasional cleanings) and pharmacy services Remarks: Hours of Operation: Monday - Thursday: 8:00AM - 4:30PM; Appointments: Monday - Thursday 9:00AM - 4:00PM. [phone]). Walk-ins are not accepted.  [facility] Location: [place], [place]- [id number] Contact Phone: [phone] Services: Primary Medical Care Remarks: Administrative/Clinic - See more at: [email or web address]

## 2014-07-12 NOTE — ED Notes (Signed)
Patient states she is not going to take any medicine without approval from her mother.  States she was "on too much medicine that made me crazy."

## 2014-07-12 NOTE — ED Notes (Signed)
Pt. C/o difficulty sleeping.

## 2014-07-12 NOTE — ED Notes (Signed)
Report received from Luann RN. Pt. Alert and oriented in no distress denies SI, HI, AVH and pain. Will continue to monitor for safety. Pt. Instructed to come to me with problems or concerns. Q 15 minute checks continue. 
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
## 2014-07-13 ENCOUNTER — Encounter (HOSPITAL_COMMUNITY): Payer: Self-pay | Admitting: Psychiatry

## 2014-07-13 DIAGNOSIS — F29 Unspecified psychosis not due to a substance or known physiological condition: Secondary | ICD-10-CM | POA: Diagnosis present

## 2014-07-13 LAB — RAPID URINE DRUG SCREEN, HOSP PERFORMED
Amphetamines: NOT DETECTED
Barbiturates: NOT DETECTED
Benzodiazepines: NOT DETECTED
Cocaine: NOT DETECTED
Opiates: NOT DETECTED
Tetrahydrocannabinol: POSITIVE — AB

## 2014-07-13 LAB — PREGNANCY, URINE: Preg Test, Ur: NEGATIVE

## 2014-07-13 MED ORDER — LORAZEPAM 1 MG PO TABS: 1.0000 mg | ORAL_TABLET | Freq: Three times a day (TID) | ORAL | Status: DC | PRN

## 2014-07-13 MED ORDER — QUETIAPINE FUMARATE 50 MG PO TABS: 50.0000 mg | ORAL_TABLET | Freq: Every day | ORAL | Status: DC

## 2014-07-13 MED ORDER — QUETIAPINE FUMARATE 25 MG PO TABS: 25.0000 mg | ORAL_TABLET | Freq: Two times a day (BID) | ORAL | Status: DC

## 2014-07-13 NOTE — ED Notes (Signed)
Up to the phone 

## 2014-07-13 NOTE — Progress Notes (Signed)
NCM spoke to pt and provided pt with coupon [email or web address] for Seroquel. Pt can purchase medication for $13.25 with coupon. Pt to follow up with [facility] on Monday to schedule an appt. Isidoro DonningAlesia Xaniyah Buchholz RN CCM Case Mgmt phone [phone]

## 2014-07-13 NOTE — ED Notes (Signed)
Jamison NP into see 

## 2014-07-13 NOTE — ED Notes (Signed)
Per request of NP, counselor provided patient outpatient treatment information for [facility] and other agencies for continuity of care.   Angela ColonelGregory Pickett Jr. MSW, LCSW Therapeutic Triage Services-Triage Specialist   Phone: [phone] Fax: [phone]

## 2014-07-13 NOTE — ED Notes (Signed)
On the phone 
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
## 2014-07-13 NOTE — ED Notes (Signed)
Dr Lolly Mustachearfeen and Catha Nottinghamjamison np intp see

## 2014-07-13 NOTE — ED Notes (Signed)
Resting quielty, waiting for ride home

## 2014-07-13 NOTE — ED Notes (Signed)
Case management into see.

## 2014-07-13 NOTE — ED Notes (Signed)
Written dc instructions and rx x3 reviewed w/ patient.  Pt encouraged to take medications as directed, [facility] as directed, and return/seek treatment for return of suicidal/homicidal thoughts urges or AVH.  Pt verbalized understanding.  Pt ambulatory to dc window w/ mHt, belongings returned after leaving the area.   Pt's mother is here to drive her home. Pt instructed to remove nicotine patch before smoking.
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
## 2014-08-12 ENCOUNTER — Encounter (HOSPITAL_COMMUNITY): Payer: Self-pay | Admitting: Psychiatry

## 2015-09-13 IMAGING — CR DG CHEST 2V
2 series · 2 of 2 positions shown · non-contrast
Comparison: DG CHEST 2 VIEW dated 12/12/2010

CLINICAL DATA: SHORTNESS OF BREATH

EXAM:
CHEST  2 VIEW

[w chest pa]
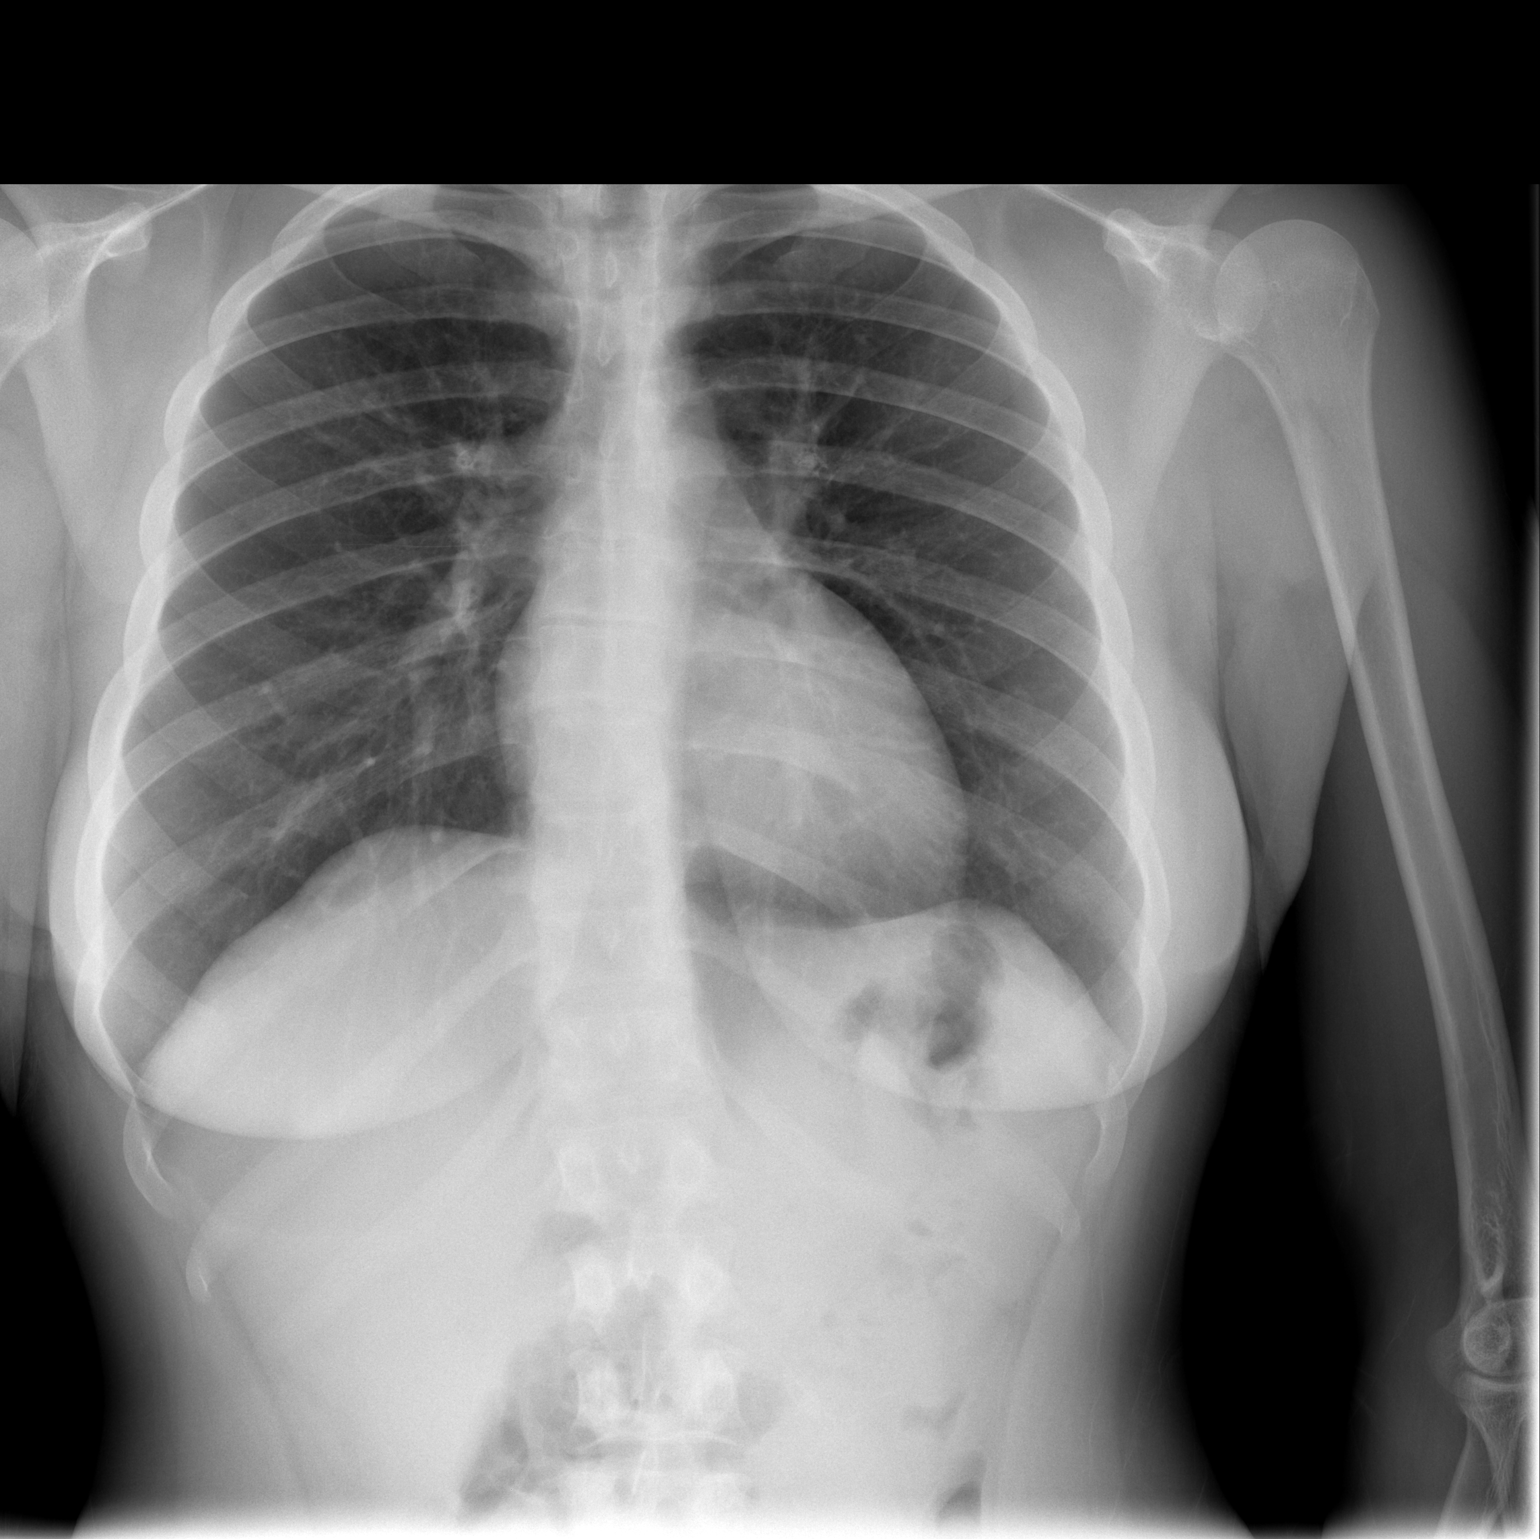

[w chest lat]
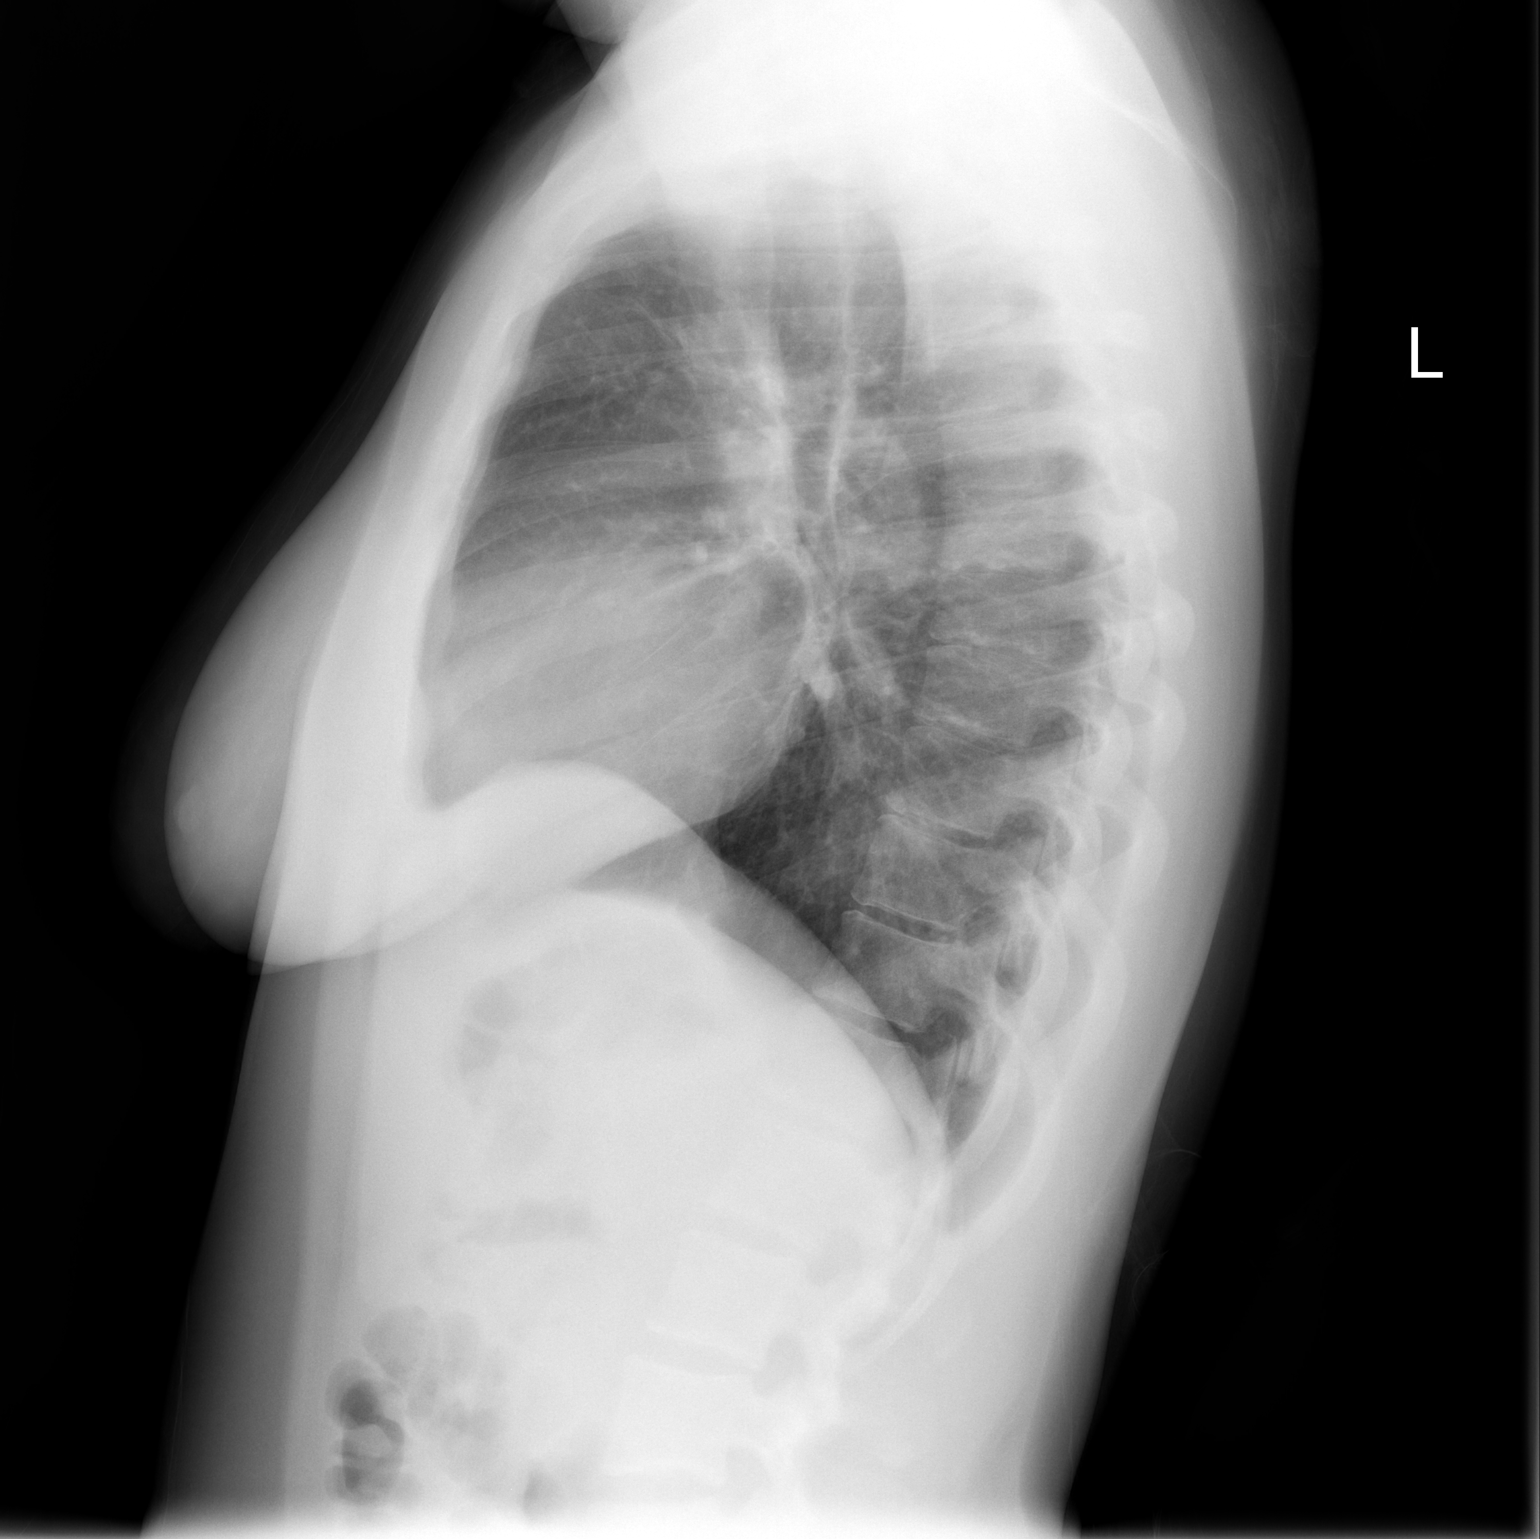

[2 of 2 positions shown; findings below may reference images not displayed]

FINDINGS: The heart size and mediastinal contours are within normal limits.
Trace perihilar peribronchial cuffing. Both lungs are clear. The
visualized skeletal structures are unremarkable. Broad thoracic
scoliosis.
IMPRESSION: Mild bronchitic changes without focal consolidations.

  By: Krescht Inghelram

## 2017-08-15 ENCOUNTER — Other Ambulatory Visit: Payer: Self-pay

## 2017-08-15 ENCOUNTER — Emergency Department (HOSPITAL_COMMUNITY)
Admission: EM | Admit: 2017-08-15 | Discharge: 2017-08-15 | Disposition: A | Discharge status: Home / Self Care | Payer: Medicaid Other | Attending: Emergency Medicine | Admitting: Emergency Medicine

## 2017-08-15 ENCOUNTER — Encounter (HOSPITAL_COMMUNITY): Payer: Self-pay | Admitting: Emergency Medicine

## 2017-08-15 DIAGNOSIS — W269XXD Contact with unspecified sharp object(s), subsequent encounter: Secondary | ICD-10-CM | POA: Insufficient documentation

## 2017-08-15 DIAGNOSIS — Z79899 Other long term (current) drug therapy: Secondary | ICD-10-CM | POA: Diagnosis not present

## 2017-08-15 DIAGNOSIS — R200 Anesthesia of skin: Secondary | ICD-10-CM | POA: Insufficient documentation

## 2017-08-15 DIAGNOSIS — S61212D Laceration without foreign body of right middle finger without damage to nail, subsequent encounter: Secondary | ICD-10-CM | POA: Insufficient documentation

## 2017-08-15 DIAGNOSIS — J45909 Unspecified asthma, uncomplicated: Secondary | ICD-10-CM | POA: Diagnosis not present

## 2017-08-15 DIAGNOSIS — F17210 Nicotine dependence, cigarettes, uncomplicated: Secondary | ICD-10-CM | POA: Diagnosis not present

## 2017-08-15 HISTORY — DX: Unspecified psychosis not due to a substance or known physiological condition: F29

## 2017-08-15 HISTORY — DX: Anxiety disorder, unspecified: F41.9

## 2017-08-15 NOTE — ED Provider Notes (Signed)
[facility] EMERGENCY DEPARTMENT Provider Note   CSN: [id number] Arrival date & time: 08/15/17  1548     History   Chief Complaint Chief Complaint  Patient presents with  . Hand Injury    HPI Angela Spencer is a 26 y.o. female.  HPI   Angela CraftsKeona J Spencer is a 26 y.o. female who presents to the Emergency Department requesting evaluation of numbness and tingling to her right middle finger. States she was seen and treated for the laceration to the finger at another facility on 07/21/17.  States wound has healed well, but she continues to have numbness and tingling of the finger and decreased  in flexion.  Denies pain, swelling, drainage and redness.  No wrist pain or numbness  Past Medical History:  Diagnosis Date  . Aneurysm [facility](HCC) Nov 2011   Brain; resolved Oct 2013  . Anxiety   . Asthma   . Migraine headache   . Multiple allergies   . Psychotic disorder [facility](HCC)     Patient Active Problem List   Diagnosis Date Noted  . Psychotic disorder (HCC) 07/13/2014  . Anxiety disorder 07/12/2014    Past Surgical History:  Procedure Laterality Date  . ADENOIDECTOMY    . arm surgery     broken left arm at elbow  . TONSILLECTOMY      OB History    Gravida Para Term Preterm AB Living   3 2 2   1 1    SAB TAB Ectopic Multiple Live Births   1       1       Home Medications    Prior to Admission medications   Medication Sig Start Date End Date Taking? Authorizing Provider  albuterol (PROVENTIL HFA;VENTOLIN HFA) 108 (90 BASE) MCG/ACT inhaler Inhale 1-2 puffs into the lungs every 6 (six) hours as needed for wheezing or shortness of breath. 01/20/14   Palumbo, April, MD  LORazepam (ATIVAN) 1 MG tablet Take 1 tablet (1 mg total) by mouth every 8 (eight) hours as needed for anxiety (agitation). 07/13/14   Charm RingsLord, Jamison Y, NP  QUEtiapine (SEROQUEL) 25 MG tablet Take 1 tablet (25 mg total) by mouth 2 (two) times daily. 07/13/14   Charm RingsLord, Jamison Y, NP  QUEtiapine (SEROQUEL) 50 MG tablet Take 1  tablet (50 mg total) by mouth at bedtime. 07/13/14   Charm RingsLord, Jamison Y, NP    Family History History reviewed. No pertinent family history.  Social History Social History   Tobacco Use  . Smoking status: Current Every Day Smoker    Packs/day: 0.50  . Smokeless tobacco: Never Used  Substance Use Topics  . Alcohol use: Yes    Comment: "barely"  . Drug use: Yes    Types: Marijuana    Comment: last time today     Allergies   Flexeril [cyclobenzaprine hcl]; Penicillins; and Tramadol   Review of Systems Review of Systems  Constitutional: Negative for chills and fever.  Musculoskeletal: Negative for arthralgias and joint swelling.  Skin: Positive for wound. Negative for color change.       Previous laceration to right middle finger  Neurological: Positive for numbness (numbness and tingling to the right middle finger). Negative for weakness.  All other systems reviewed and are negative.    Physical Exam Updated Vital Signs BP (!) 156/93 (BP Location: Right Arm)   Pulse 97   Temp 98.2 F (36.8 C) (Oral)   Resp 18   Ht 5\' 3"  (1.6 m)   Wt  72.6 kg (160 lb)   LMP 07/27/2017   SpO2 100%   BMI 28.34 kg/m   Physical Exam  Constitutional: She is oriented to person, place, and time. She appears well-developed and well-nourished. No distress.  HENT:  Head: Normocephalic and atraumatic.  Cardiovascular: Normal rate, regular rhythm and intact distal pulses.  No murmur heard. Pulmonary/Chest: Effort normal and breath sounds normal. No respiratory distress.  Musculoskeletal: She exhibits no edema, tenderness or deformity.  Limited palmar flexion of the right middle finger , no edema  Neurological: She is alert and oriented to person, place, and time. She exhibits normal muscle tone. Coordination normal.  Skin: Skin is warm. Capillary refill takes less than 2 seconds. Laceration noted.  Healing laceration to the palmar aspect of the right middle finger at the level of the PIP  joint.  No erythema, drainage.    Nursing note and vitals reviewed.    ED Treatments / Results  Labs (all labs ordered are listed, but only abnormal results are displayed) Labs Reviewed - No data to display  EKG  EKG Interpretation None       Radiology No results found.  Procedures Procedures (including critical care time)  Medications Ordered in ED Medications - No data to display   Initial Impression / Assessment and Plan / ED Course  I have reviewed the triage vital signs and the nursing notes.  Pertinent labs & imaging results that were available during my care of the patient were reviewed by me and considered in my medical decision making (see chart for details).     Pt with healing lac to the right middle finger.  No clinical concerns for infection.  Decreased pin point sensation of the finger and ROM.  Pt advised of likely tendon and nerve injury.  Verbalized understanding.  Agrees to arrange orthopedic f/u   Final Clinical Impressions(s) / ED Diagnoses   Final diagnoses:  Numbness of finger    ED Discharge Orders    None       Pauline Aus, PA-C 08/15/17 1631    Bethann Berkshire, MD 08/15/17 2228

## 2017-08-15 NOTE — ED Triage Notes (Signed)
PT states decreased sensation to right hand middle finger after laceration on 07/21/17.

## 2017-08-15 NOTE — Discharge Instructions (Signed)
Continue to clean the area with mild soap and water.  Call one of the orthopedic providers listed to arrange a follow-up appt

## 2017-09-09 ENCOUNTER — Ambulatory Visit (INDEPENDENT_AMBULATORY_CARE_PROVIDER_SITE_OTHER): Payer: Medicaid Other | Admitting: Family Medicine

## 2017-09-09 VITALS — BP 124/88 | Ht 63.0 in | Wt 160.0 lb

## 2017-09-09 DIAGNOSIS — R202 Paresthesia of skin: Secondary | ICD-10-CM

## 2017-09-09 NOTE — Progress Notes (Signed)
    CHIEF COMPLAINT / HPI: Right long finger pain and numbness. This started after she had several lacerations to her hand a few months ago. They were sewn up at the emergency department. She continues to have numbness on the volar surface of that finger. She also feels like she can't quite close at hand with grip strength that she typically has.  REVIEW OF SYSTEMS:  Follow her numbness on the right long finger. No other unusual paresthesias or numbness. No skin rash. No fever. No weight change.  PERTINENT  PMH / PSH: I have reviewed the patient's medications, allergies, past medical and surgical history, smoking status and updated in the EMR as appropriate.   OBJECTIVE:     GEN.: Well-developed female no acute distress HANDS: Normal and symmetrical in appearance. She has intact sensation to soft touch except for some altered sensation on the volar portion of the right long finger. It extends through the length of the finger but when she reached the palm, she has normal sensation. She has normal flexion and extension bilateral hand/fingers. Grip strength is a little less on the right compared with the left.  ASSESSMENT / PLAN: #1. Altered sensation. Could be from cutaqneous nerve injury. I suspect this will resolve itself in several months and she is reassured. May improve quicker if we try some capsaicin cream. I would also put her on some hand exercises, finger flexion and extension. She's given a handout and exercises were demonstrated. I'll follow her up in one month. I suspect this will all self resolve on its own.

## 2017-09-09 NOTE — Patient Instructions (Signed)
Please do the hand and finger exercises as we have discussed. You might also want to try some OTC capsaicin cream.    you can ask the druggist for it. Apply it to numb area at bedtime, be careful to not get it into your eyes Let me see you in a month

## 2017-09-30 ENCOUNTER — Encounter: Payer: Self-pay | Admitting: Family Medicine

## 2017-09-30 ENCOUNTER — Ambulatory Visit (INDEPENDENT_AMBULATORY_CARE_PROVIDER_SITE_OTHER): Payer: Medicaid Other | Admitting: Family Medicine

## 2017-09-30 VITALS — BP 131/76 | Ht 63.0 in | Wt 160.0 lb

## 2017-09-30 DIAGNOSIS — R202 Paresthesia of skin: Secondary | ICD-10-CM
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
## 2017-10-10 ENCOUNTER — Telehealth: Payer: Self-pay | Admitting: *Deleted

## 2017-10-10 NOTE — Telephone Encounter (Signed)
Letter faxed to Rhys MartiniLatoya Petty at [phone]

## 2017-10-27 ENCOUNTER — Ambulatory Visit (INDEPENDENT_AMBULATORY_CARE_PROVIDER_SITE_OTHER): Payer: Medicaid Other | Admitting: Otolaryngology

## 2017-10-27 DIAGNOSIS — H6523 Chronic serous otitis media, bilateral: Secondary | ICD-10-CM

## 2017-10-27 DIAGNOSIS — H6983 Other specified disorders of Eustachian tube, bilateral: Secondary | ICD-10-CM | POA: Diagnosis not present

## 2017-10-27 DIAGNOSIS — H900 Conductive hearing loss, bilateral: Secondary | ICD-10-CM

## 2017-11-02 ENCOUNTER — Encounter (HOSPITAL_BASED_OUTPATIENT_CLINIC_OR_DEPARTMENT_OTHER): Payer: Self-pay | Admitting: *Deleted

## 2017-11-02 ENCOUNTER — Other Ambulatory Visit: Payer: Self-pay | Admitting: Otolaryngology

## 2017-11-02 ENCOUNTER — Other Ambulatory Visit: Payer: Self-pay
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
## 2017-11-05 NOTE — Anesthesia Preprocedure Evaluation (Signed)
Anesthesia Evaluation  Patient identified by MRN, date of birth, ID band Patient awake    Reviewed: Allergy & Precautions, NPO status , Patient's Chart, lab work & pertinent test results  Airway Mallampati: II  TM Distance: >3 FB Neck ROM: Full    Dental no notable dental hx.    Pulmonary neg pulmonary ROS, asthma , former smoker,    Pulmonary exam normal breath sounds clear to auscultation       Cardiovascular negative cardio ROS Normal cardiovascular exam Rhythm:Regular Rate:Normal     Neuro/Psych  Headaches, PSYCHIATRIC DISORDERS Anxiety Depression Schizophrenia negative neurological ROS  negative psych ROS   GI/Hepatic negative GI ROS, Neg liver ROS,   Endo/Other  negative endocrine ROS  Renal/GU negative Renal ROS  negative genitourinary   Musculoskeletal negative musculoskeletal ROS (+)   Abdominal   Peds negative pediatric ROS (+)  Hematology negative hematology ROS (+)   Anesthesia Other Findings   Reproductive/Obstetrics negative OB ROS                             Anesthesia Physical Anesthesia Plan  ASA: III  Anesthesia Plan: General   Post-op Pain Management:    Induction: Intravenous  PONV Risk Score and Plan: Treatment may vary due to age or medical condition and Ondansetron  Airway Management Planned: Mask  Additional Equipment:   Intra-op Plan:   Post-operative Plan:   Informed Consent: I have reviewed the patients History and Physical, chart, labs and discussed the procedure including the risks, benefits and alternatives for the proposed anesthesia with the patient or authorized representative who has indicated his/her understanding and acceptance.     Plan Discussed with: CRNA, Surgeon and Anesthesiologist  Anesthesia Plan Comments: ( )        Anesthesia Quick Evaluation

## 2017-11-07 ENCOUNTER — Other Ambulatory Visit: Payer: Self-pay

## 2017-11-07 ENCOUNTER — Ambulatory Visit (HOSPITAL_BASED_OUTPATIENT_CLINIC_OR_DEPARTMENT_OTHER)
Admission: RE | Admit: 2017-11-07 | Discharge: 2017-11-07 | Disposition: A | Discharge status: Home / Self Care | Payer: Medicaid Other | Source: Ambulatory Visit | Attending: Otolaryngology | Admitting: Otolaryngology

## 2017-11-07 ENCOUNTER — Encounter (HOSPITAL_BASED_OUTPATIENT_CLINIC_OR_DEPARTMENT_OTHER)
Admission: RE | Disposition: A | Discharge status: Home / Self Care | Payer: Self-pay | Source: Ambulatory Visit | Attending: Otolaryngology

## 2017-11-07 ENCOUNTER — Ambulatory Visit (HOSPITAL_BASED_OUTPATIENT_CLINIC_OR_DEPARTMENT_OTHER): Payer: Medicaid Other | Admitting: Anesthesiology

## 2017-11-07 ENCOUNTER — Encounter (HOSPITAL_BASED_OUTPATIENT_CLINIC_OR_DEPARTMENT_OTHER): Payer: Self-pay

## 2017-11-07 DIAGNOSIS — F419 Anxiety disorder, unspecified: Secondary | ICD-10-CM | POA: Diagnosis not present

## 2017-11-07 DIAGNOSIS — H900 Conductive hearing loss, bilateral: Secondary | ICD-10-CM | POA: Diagnosis not present

## 2017-11-07 DIAGNOSIS — Z888 Allergy status to other drugs, medicaments and biological substances status: Secondary | ICD-10-CM | POA: Insufficient documentation

## 2017-11-07 DIAGNOSIS — Z87891 Personal history of nicotine dependence: Secondary | ICD-10-CM | POA: Insufficient documentation

## 2017-11-07 DIAGNOSIS — J45909 Unspecified asthma, uncomplicated: Secondary | ICD-10-CM | POA: Diagnosis not present

## 2017-11-07 DIAGNOSIS — Z79899 Other long term (current) drug therapy: Secondary | ICD-10-CM | POA: Diagnosis not present

## 2017-11-07 DIAGNOSIS — Z885 Allergy status to narcotic agent status: Secondary | ICD-10-CM | POA: Diagnosis not present

## 2017-11-07 DIAGNOSIS — H6983 Other specified disorders of Eustachian tube, bilateral: Secondary | ICD-10-CM | POA: Diagnosis present

## 2017-11-07 DIAGNOSIS — H6523 Chronic serous otitis media, bilateral: Secondary | ICD-10-CM | POA: Diagnosis not present

## 2017-11-07 DIAGNOSIS — F209 Schizophrenia, unspecified: Secondary | ICD-10-CM | POA: Insufficient documentation

## 2017-11-07 DIAGNOSIS — Z880 Allergy status to penicillin: Secondary | ICD-10-CM | POA: Insufficient documentation

## 2017-11-07 DIAGNOSIS — F329 Major depressive disorder, single episode, unspecified: Secondary | ICD-10-CM | POA: Diagnosis not present

## 2017-11-07 HISTORY — DX: Major depressive disorder, single episode, unspecified: F32.9

## 2017-11-07 HISTORY — DX: Depression, unspecified: F32.A

## 2017-11-07 HISTORY — PX: MYRINGOTOMY WITH TUBE PLACEMENT: SHX5663

## 2017-11-07 LAB — POCT PREGNANCY, URINE: Preg Test, Ur: NEGATIVE

## 2017-11-07 SURGERY — MYRINGOTOMY WITH TUBE PLACEMENT
Anesthesia: General | Site: Ear | Laterality: Bilateral

## 2017-11-07 MED ORDER — DEXAMETHASONE SODIUM PHOSPHATE 10 MG/ML IJ SOLN
INTRAMUSCULAR | Status: AC
  Filled 2017-11-07: qty 1

## 2017-11-07 MED ORDER — MIDAZOLAM HCL 2 MG/2ML IJ SOLN
1.0000 mg | INTRAMUSCULAR | Status: DC | PRN
  Administered 2017-11-07: 2 mg via INTRAVENOUS

## 2017-11-07 MED ORDER — CIPROFLOXACIN-FLUOCINOLONE PF 0.3-0.025 % OT SOLN
OTIC | Status: AC
  Filled 2017-11-07: qty 0.25

## 2017-11-07 MED ORDER — FENTANYL CITRATE (PF) 100 MCG/2ML IJ SOLN: 25.0000 ug | INTRAMUSCULAR | Status: DC | PRN

## 2017-11-07 MED ORDER — ONDANSETRON HCL 4 MG/2ML IJ SOLN
4.0000 mg | Freq: Once | INTRAMUSCULAR | Status: AC | PRN
  Administered 2017-11-07: 4 mg via INTRAVENOUS

## 2017-11-07 MED ORDER — SCOPOLAMINE 1 MG/3DAYS TD PT72: 1.0000 | MEDICATED_PATCH | Freq: Once | TRANSDERMAL | Status: DC | PRN

## 2017-11-07 MED ORDER — PROPOFOL 10 MG/ML IV BOLUS
INTRAVENOUS | Status: DC | PRN
  Administered 2017-11-07 (×2): 100 mg via INTRAVENOUS

## 2017-11-07 MED ORDER — ONDANSETRON HCL 4 MG/2ML IJ SOLN
INTRAMUSCULAR | Status: AC
  Filled 2017-11-07: qty 2

## 2017-11-07 MED ORDER — OXYCODONE HCL 5 MG PO TABS: 5.0000 mg | ORAL_TABLET | Freq: Once | ORAL | Status: DC | PRN

## 2017-11-07 MED ORDER — ACETAMINOPHEN 160 MG/5ML PO SOLN: 325.0000 mg | ORAL | Status: DC | PRN

## 2017-11-07 MED ORDER — PROPOFOL 10 MG/ML IV BOLUS
INTRAVENOUS | Status: AC
Start: 2017-11-07 — End: ?
  Filled 2017-11-07: qty 40

## 2017-11-07 MED ORDER — ALBUTEROL SULFATE (2.5 MG/3ML) 0.083% IN NEBU
INHALATION_SOLUTION | RESPIRATORY_TRACT | Status: AC
  Filled 2017-11-07: qty 3

## 2017-11-07 MED ORDER — OXYCODONE HCL 5 MG/5ML PO SOLN: 5.0000 mg | Freq: Once | ORAL | Status: DC | PRN

## 2017-11-07 MED ORDER — FENTANYL CITRATE (PF) 100 MCG/2ML IJ SOLN
50.0000 ug | INTRAMUSCULAR | Status: DC | PRN
  Administered 2017-11-07: 100 ug via INTRAVENOUS

## 2017-11-07 MED ORDER — DEXAMETHASONE SODIUM PHOSPHATE 4 MG/ML IJ SOLN
INTRAMUSCULAR | Status: DC | PRN
  Administered 2017-11-07: 10 mg via INTRAVENOUS

## 2017-11-07 MED ORDER — ACETAMINOPHEN 325 MG PO TABS: 325.0000 mg | ORAL_TABLET | ORAL | Status: DC | PRN

## 2017-11-07 MED ORDER — KETOROLAC TROMETHAMINE 30 MG/ML IJ SOLN: 30.0000 mg | Freq: Once | INTRAMUSCULAR | Status: DC | PRN

## 2017-11-07 MED ORDER — LACTATED RINGERS IV SOLN
INTRAVENOUS | Status: DC
  Administered 2017-11-07: 08:00:00 via INTRAVENOUS

## 2017-11-07 MED ORDER — CIPROFLOXACIN-FLUOCINOLONE PF 0.3-0.025 % OT SOLN
OTIC | Status: DC | PRN
  Administered 2017-11-07: 1 mL via OTIC

## 2017-11-07 MED ORDER — LIDOCAINE HCL (CARDIAC) 20 MG/ML IV SOLN
INTRAVENOUS | Status: DC | PRN
  Administered 2017-11-07: 100 mg via INTRAVENOUS

## 2017-11-07 MED ORDER — FENTANYL CITRATE (PF) 100 MCG/2ML IJ SOLN
INTRAMUSCULAR | Status: AC
  Filled 2017-11-07: qty 2

## 2017-11-07 MED ORDER — ALBUTEROL SULFATE (2.5 MG/3ML) 0.083% IN NEBU
1.2000 mg | INHALATION_SOLUTION | Freq: Once | RESPIRATORY_TRACT | Status: AC | PRN
  Administered 2017-11-07: 1.6667 mg via RESPIRATORY_TRACT

## 2017-11-07 MED ORDER — GLYCOPYRROLATE 0.2 MG/ML IJ SOLN
INTRAMUSCULAR | Status: DC | PRN
  Administered 2017-11-07: 0.2 mg via INTRAVENOUS

## 2017-11-07 MED ORDER — LIDOCAINE 2% (20 MG/ML) 5 ML SYRINGE
INTRAMUSCULAR | Status: AC
  Filled 2017-11-07: qty 5

## 2017-11-07 MED ORDER — MEPERIDINE HCL 25 MG/ML IJ SOLN: 6.2500 mg | INTRAMUSCULAR | Status: DC | PRN

## 2017-11-07 MED ORDER — ALBUTEROL SULFATE HFA 108 (90 BASE) MCG/ACT IN AERS
INHALATION_SPRAY | RESPIRATORY_TRACT | Status: DC | PRN
  Administered 2017-11-07: 8 via RESPIRATORY_TRACT

## 2017-11-07 MED ORDER — MIDAZOLAM HCL 2 MG/2ML IJ SOLN
INTRAMUSCULAR | Status: AC
  Filled 2017-11-07: qty 2

## 2017-11-07 MED ORDER — OXYMETAZOLINE HCL 0.05 % NA SOLN
NASAL | Status: AC
  Filled 2017-11-07: qty 15

## 2017-11-07 SURGICAL SUPPLY — 12 items
BALL CTTN LRG ABS STRL LF (GAUZE/BANDAGES/DRESSINGS) ×2
BLADE MYRINGOTOMY 45DEG STRL (BLADE) ×2 IMPLANT
CANISTER SUCT 1200ML W/VALVE (MISCELLANEOUS) ×1 IMPLANT
COTTONBALL LRG STERILE PKG (GAUZE/BANDAGES/DRESSINGS) ×3 IMPLANT
GAUZE SPONGE 4X4 12PLY STRL LF (GAUZE/BANDAGES/DRESSINGS) IMPLANT
GLOVE ECLIPSE 6.5 STRL STRAW (GLOVE) ×1 IMPLANT
IV SET EXT 30 76VOL 4 MALE LL (IV SETS) ×1 IMPLANT
NS IRRIG 1000ML POUR BTL (IV SOLUTION) IMPLANT
TOWEL OR 17X24 6PK STRL BLUE (TOWEL DISPOSABLE) ×2 IMPLANT
TUBE CONNECTING 20X1/4 (TUBING) ×2 IMPLANT
TUBE EAR SHEEHY BUTTON 1.27 (OTOLOGIC RELATED) ×2 IMPLANT
TUBE EAR T MOD 1.32X4.8 BL (OTOLOGIC RELATED) ×2 IMPLANT

## 2017-11-07 NOTE — Anesthesia Procedure Notes (Signed)
Procedure Name: General with mask airway Performed by: Karen KitchensKelly, Destynee Stringfellow M, CRNA Pre-anesthesia Checklist: Patient identified, Emergency Drugs available, Suction available, Patient being monitored and Timeout performed Preoxygenation: Pre-oxygenation with 100% oxygen Induction Type: IV induction Ventilation: Oral airway inserted - appropriate to patient size and Mask ventilation without difficulty Placement Confirmation: CO2 detector,  positive ETCO2 and breath sounds checked- equal and bilateral Dental Injury: Teeth and Oropharynx as per pre-operative assessment

## 2017-11-07 NOTE — Discharge Instructions (Addendum)
POSTOPERATIVE INSTRUCTIONS FOR PATIENTS HAVING MYRINGOTOMY AND TUBES ° °1. Please use the ear drops in each ear with a new tube as instructed. Use the drops as prescribed by your doctor, placing the drops into the outer opening of the ear canal with the head tilted to the opposite side. Place a clean piece of cotton into the ear after using drops. A small amount of blood tinged drainage is not uncommon for several days after the tubes are inserted. °2. Nausea and vomiting may be expected the first 6 hours after surgery. Offer liquids initially. If there is no nausea, small light meals are usually best tolerated the day of surgery. A normal diet may be resumed once nausea has passed. °3. The patient may experience mild ear discomfort the day of surgery, which is usually relieved by Tylenol. °4. A small amount of clear or blood-tinged drainage from the ears may occur a few days after surgery. If this should persists or become thick, green, yellow, or foul smelling, please contact our office at [phone]. °5. If you see clear, green, or yellow drainage from your child’s ear during colds, clean the outer ear gently with a soft, damp washcloth. Begin the prescribed ear drops (4 drops, twice a day) for one week, as previously instructed.  The drainage should stop within 48 hours after starting the ear drops. If the drainage continues or becomes yellow or green, please call our office. If your child develops a fever greater than 102 F, or has and persistent bleeding from the ear(s), please call us. °6. Try to avoid getting water in the ears. Swimming is permitted as long as there is no deep diving or swimming under water deeper than 3 feet. If you think water has gotten into the ear(s), either bathing or swimming, place 4 drops of the prescribed ear drops into the ear in question. We do recommend drops after swimming in the ocean, rivers, or lakes. °7. It is important for you to return for your scheduled appointment  so that the status of the tubes can be determined.  ° ° °Post Anesthesia Home Care Instructions ° °Activity: °Get plenty of rest for the remainder of the day. A responsible individual must stay with you for 24 hours following the procedure.  °For the next 24 hours, DO NOT: °-Drive a car °-Operate machinery °-Drink alcoholic beverages °-Take any medication unless instructed by your physician °-Make any legal decisions or sign important papers. ° °Meals: °Start with liquid foods such as gelatin or soup. Progress to regular foods as tolerated. Avoid greasy, spicy, heavy foods. If nausea and/or vomiting occur, drink only clear liquids until the nausea and/or vomiting subsides. Call your physician if vomiting continues. ° °Special Instructions/Symptoms: °Your throat may feel dry or sore from the anesthesia or the breathing tube placed in your throat during surgery. If this causes discomfort, gargle with warm salt water. The discomfort should disappear within 24 hours. ° °If you had a scopolamine patch placed behind your ear for the management of post- operative nausea and/or vomiting: ° °1. The medication in the patch is effective for 72 hours, after which it should be removed.  Wrap patch in a tissue and discard in the trash. Wash hands thoroughly with soap and water. °2. You may remove the patch earlier than 72 hours if you experience unpleasant side effects which may include dry mouth, dizziness or visual disturbances. °3. Avoid touching the patch. Wash your hands with soap and water after contact with the   patch. °   ° ° °

## 2017-11-07 NOTE — H&P (Signed)
Cc: Recurrent ear infections  HPI: The patient is a 27 year old female who presents today complaining of frequent recurrent ear infections.  The patient is seen in consultation requested by [facility].  According to the patient, she has been having frequent ear infections for more than 10 years.  She typically has 10 to 12 episodes a year.  Her symptoms include otalgia, occasional otorrhea and muffled hearing. She was treated with multiple courses of antibiotics.  Her last infection was 2 weeks ago.  The patient has no previous history of otologic surgery.  She underwent adenotonsillectomy at age 89.    The patient's review of systems (constitutional, eyes, ENT, cardiovascular, respiratory, GI, musculoskeletal, skin, neurologic, psychiatric, endocrine, hematologic, allergic) is noted in the ROS questionnaire.  It is reviewed with the patient.   Family health history: None.  Major events: Fractured left elbow, tonsillectomy and adenoidectomy.  Ongoing medical problems: Fever, migraine, depression and anxiety, eczema, nausea, allergies.  Social history: The patient is single. She is a former smoker. She denies the use of alcohol or illegal drugs.   Exam General: Communicates without difficulty, well nourished, no acute distress. Head: Normocephalic, no evidence injury, no tenderness, facial buttresses intact without stepoff. Face/sinus: No tenderness to palpation and percussion. Facial movement is normal and symmetric. Eyes: PERRL, EOMI. No scleral icterus, conjunctivae clear. Neuro: CN II exam reveals vision grossly intact.  No nystagmus at any point of gaze. Ears: Auricles well formed without lesions.  Ear canals are intact without mass or lesion.  No erythema or edema is appreciated.  Both tympanic membranes are noted to be intact but mildly retracted.  Partial serous fluid is noted within both middle ear spaces.  Nose: External evaluation reveals normal support and skin without lesions.   Dorsum is intact.  Anterior rhinoscopy reveals healthy pink mucosa over anterior aspect of inferior turbinates and intact septum.  No purulence noted. Oral:  Oral cavity and oropharynx are intact, symmetric, without erythema or edema.  Mucosa is moist without lesions. Neck: Full range of motion without pain.  There is no significant lymphadenopathy.  No masses palpable.  Thyroid bed within normal limits to palpation.  Parotid glands and submandibular glands equal bilaterally without mass.  Trachea is midline. Neuro:  CN 2-12 grossly intact. Gait normal.   AUDIOMETRIC TESTING:  I reviewed the audiometric result. The test shows bilateral mild hearing loss. The speech reception threshold is 20dB AD and 20dB AS. The discrimination score is 100% AD and 100% AS. The tympanogram shows negative middle ear pressure bilaterally.   Assessment 1.  The patient's recent otitis media has resolved.  No acute infection is noted today.   2.  Both tympanic membranes are noted to be intact but mildly retracted.  Partial serous fluid is noted within both middle ear spaces.   3.  Bilateral mild conductive hearing loss.  Plan  1.  The physical exam findings and the hearing test results are reviewed with the patient.   2.  The options include continuing conservative observation versus bilateral myringotomy and tube placement.  The risks, benefits, details of the procedure are reviewed.  3.  The patient would like to proceed with the myringotomy and tube placement procedure.  We will schedule the procedure in accordance with the family's schedule.

## 2017-11-07 NOTE — Op Note (Signed)
DATE OF PROCEDURE:  11/07/2017                              OPERATIVE REPORT  SURGEON:  Newman PiesSu Jazmyn Offner, MD  PREOPERATIVE DIAGNOSES: 1. Bilateral eustachian tube dysfunction. 2. Bilateral recurrent otitis media.  POSTOPERATIVE DIAGNOSES: 1. Bilateral eustachian tube dysfunction. 2. Bilateral recurrent otitis media.  PROCEDURE PERFORMED: 1) Bilateral myringotomy and tube placement.          ANESTHESIA:  General facemask anesthesia.  COMPLICATIONS:  None.  ESTIMATED BLOOD LOSS:  Minimal.  INDICATION FOR PROCEDURE:   Angela Spencer is a 27 y.o. female with a history of frequent recurrent ear infections.  Despite multiple courses of antibiotics, the patient continues to be symptomatic.  On examination, the patient was noted to have partial middle ear effusion bilaterally.  Based on the above findings, the decision was made for the patient to undergo the myringotomy and tube placement procedure. Likelihood of success in reducing symptoms was also discussed.  The risks, benefits, alternatives, and details of the procedure were discussed with the mother.  Questions were invited and answered.  Informed consent was obtained.  DESCRIPTION:  The patient was taken to the operating room and placed supine on the operating table.  General facemask anesthesia was administered by the anesthesiologist.  Under the operating microscope, the right ear canal was cleaned of all cerumen.  The tympanic membrane was noted to be intact but mildly retracted.  A standard myringotomy incision was made at the anterior-inferior quadrant on the tympanic membrane.  A scant amount of serous fluid was suctioned from behind the tympanic membrane. A T tube was placed, followed by antibiotic eardrops in the ear canal.  The same procedure was repeated on the left side without exception. The care of the patient was turned over to the anesthesiologist.  The patient was awakened from anesthesia without difficulty.  The patient was transferred  to the recovery room in good condition.  OPERATIVE FINDINGS:  A scant amount of serous effusion was noted bilaterally.  SPECIMEN:  None.  FOLLOWUP CARE:  The patient will be placed on Otovel eardrops 1 vial each ear b.i.d..  The patient will follow up in my office in approximately 4 weeks.  Kymere Fullington WOOI 11/07/2017

## 2017-11-07 NOTE — Transfer of Care (Signed)
Immediate Anesthesia Transfer of Care Note  Patient: Angela Spencer  Procedure(s) Performed: BILATERAL MYRINGOTOMY WITH TUBE PLACEMENT (Bilateral Ear)  Patient Location: PACU  Anesthesia Type:General  Level of Consciousness: awake, alert  and oriented  Airway & Oxygen Therapy: Patient Spontanous Breathing and Patient connected to face mask oxygen  Post-op Assessment: Report given to RN and Post -op Vital signs reviewed and stable  Post vital signs: Reviewed and stable  Last Vitals:  Vitals:   11/07/17 0812 11/07/17 0903  BP: 119/66 121/71  Pulse: 71   Resp: 18   Temp: (!) 36.3 C   SpO2: 99%     Last Pain:  Vitals:   11/07/17 0812  TempSrc: Oral         Complications: No apparent anesthesia complications

## 2017-11-07 NOTE — Anesthesia Postprocedure Evaluation (Signed)
Anesthesia Post Note  Patient: Vernie MurdersKeona J Decola  Procedure(s) Performed: BILATERAL MYRINGOTOMY WITH TUBE PLACEMENT (Bilateral Ear)     Patient location during evaluation: PACU Anesthesia Type: General Level of consciousness: awake and alert Pain management: pain level controlled Vital Signs Assessment: post-procedure vital signs reviewed and stable Respiratory status: spontaneous breathing, nonlabored ventilation, respiratory function stable and patient connected to nasal cannula oxygen Cardiovascular status: blood pressure returned to baseline and stable Postop Assessment: no apparent nausea or vomiting Anesthetic complications: no    Last Vitals:  Vitals:   11/07/17 0903 11/07/17 0915  BP: 121/71 (!) 141/82  Pulse: (!) 117 (!) 121  Resp: 15 16  Temp: (!) 36.1 C   SpO2: 100% 100%    Last Pain:  Vitals:   11/07/17 0812  TempSrc: Oral                 Maanasa Aderhold

## 2017-11-08 ENCOUNTER — Encounter (HOSPITAL_BASED_OUTPATIENT_CLINIC_OR_DEPARTMENT_OTHER): Payer: Self-pay | Admitting: Otolaryngology

## 2017-12-08 ENCOUNTER — Ambulatory Visit (INDEPENDENT_AMBULATORY_CARE_PROVIDER_SITE_OTHER): Payer: Self-pay | Admitting: Otolaryngology

## 2018-05-02 ENCOUNTER — Ambulatory Visit: Payer: Medicaid Other | Admitting: Occupational Therapy

## 2018-05-10 ENCOUNTER — Ambulatory Visit: Payer: Medicaid Other | Attending: Orthopedic Surgery | Admitting: Occupational Therapy

## 2018-05-10 DIAGNOSIS — M25641 Stiffness of right hand, not elsewhere classified: Secondary | ICD-10-CM | POA: Diagnosis present

## 2018-05-10 DIAGNOSIS — M79641 Pain in right hand: Secondary | ICD-10-CM | POA: Diagnosis present

## 2018-05-10 DIAGNOSIS — M6281 Muscle weakness (generalized): Secondary | ICD-10-CM | POA: Diagnosis present
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
## 2018-05-10 NOTE — Patient Instructions (Signed)
Do not perform any forceful grasp, do not pick up any thing heavy and do not forcefully extend your fingers of right hand this can rupture your tendon. Perform scar massage 2x day for 5 mins

## 2018-05-29 ENCOUNTER — Ambulatory Visit: Payer: Medicaid Other | Attending: Orthopedic Surgery | Admitting: Occupational Therapy

## 2018-06-02 ENCOUNTER — Ambulatory Visit (HOSPITAL_COMMUNITY)
Admission: EM | Admit: 2018-06-02 | Discharge: 2018-06-02 | Disposition: A | Discharge status: Home / Self Care | Payer: Medicaid Other | Attending: Internal Medicine | Admitting: Internal Medicine

## 2018-06-02 ENCOUNTER — Ambulatory Visit (INDEPENDENT_AMBULATORY_CARE_PROVIDER_SITE_OTHER): Payer: Medicaid Other

## 2018-06-02 ENCOUNTER — Encounter (HOSPITAL_COMMUNITY): Payer: Self-pay

## 2018-06-02 DIAGNOSIS — M79671 Pain in right foot: Secondary | ICD-10-CM

## 2018-06-02 DIAGNOSIS — S9031XA Contusion of right foot, initial encounter: Secondary | ICD-10-CM

## 2018-06-02 MED ORDER — IBUPROFEN 800 MG PO TABS
800.0000 mg | ORAL_TABLET | Freq: Once | ORAL | Status: AC
  Administered 2018-06-02: 800 mg via ORAL

## 2018-06-02 MED ORDER — IBUPROFEN 800 MG PO TABS
ORAL_TABLET | ORAL | Status: AC
  Filled 2018-06-02: qty 1
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
## 2018-06-02 NOTE — Discharge Instructions (Signed)
Xray of foot today was negative for bony injury/fracture.  Wear post-op shoe for comfort.  Aleve as needed for pain.  Ice for 5-10 minutes several times daily may also help with pain.  Anticipate gradual improvement in pain over the next several days.  Recheck or followup with your primary care provider or a sports med provider if not improving as expected.

## 2018-06-06 ENCOUNTER — Ambulatory Visit: Payer: Medicaid Other | Admitting: Occupational Therapy

## 2018-06-13 ENCOUNTER — Encounter: Payer: Self-pay | Admitting: Occupational Therapy

## 2019-02-02 ENCOUNTER — Ambulatory Visit: Payer: Medicaid Other | Admitting: Allergy

## 2019-02-02 ENCOUNTER — Ambulatory Visit: Payer: Medicaid Other | Admitting: Allergy & Immunology

## 2019-02-14 ENCOUNTER — Ambulatory Visit: Payer: Medicaid Other | Admitting: Allergy

## 2019-02-28 ENCOUNTER — Ambulatory Visit: Payer: Medicaid Other | Admitting: Allergy

## 2019-05-21 ENCOUNTER — Ambulatory Visit (HOSPITAL_COMMUNITY)
Admission: EM | Admit: 2019-05-21 | Discharge: 2019-05-21 | Disposition: A | Discharge status: Home / Self Care | Payer: Medicaid Other | Attending: Family Medicine | Admitting: Family Medicine

## 2019-05-21 ENCOUNTER — Other Ambulatory Visit: Payer: Self-pay

## 2019-05-21 ENCOUNTER — Encounter (HOSPITAL_COMMUNITY): Payer: Self-pay

## 2019-05-21 DIAGNOSIS — H9201 Otalgia, right ear: Secondary | ICD-10-CM | POA: Diagnosis not present

## 2019-05-21 MED ORDER — AMOXICILLIN 500 MG PO CAPS: 1000.0000 mg | ORAL_CAPSULE | Freq: Three times a day (TID) | ORAL | 0 refills | Status: AC

## 2019-05-21 NOTE — ED Triage Notes (Signed)
Patient presents to Urgent Care with complaints of right ear drainage. Patient reports the drainage is bloody appearing, has tubes in her ears and does not think the drainage is normal, has been to [facility] for same and told it was normal.

## 2019-05-21 NOTE — ED Provider Notes (Signed)
[facility]    CSN: [id number] Arrival date & time: 05/21/19  1303     History   Chief Complaint Chief Complaint  Patient presents with  . Ear Drainage    HPI [name] is a 28 y.o. female.   Patient is a 28 year old female who presents today with right ear drainage and pain.  This is been an ongoing issue for her over the past couple of  Months waxing and waning.  She has bilateral tubes.  The drainage initially was yellowish clear color. Foul smell from drainage. Reporting over the past couple days she has noticed bloody drainage and she has had  Increased pain in the right ear.  She is worried about ear infection.  Patient had the tubes placed approximately a year and half ago.  Denies any associated fever.  No injury to the ear.  ROS per HPI      Past Medical History:  Diagnosis Date  . 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 Anxiety   . Asthma   . Depression   . Migraine headache   . Multiple allergies   . Psychotic disorder [facility](HCC)     Patient Active Problem List   Diagnosis Date Noted  . Psychotic disorder (HCC) 07/13/2014  . Anxiety disorder 07/12/2014    Past Surgical History:  Procedure Laterality Date  . ADENOIDECTOMY    . arm surgery     broken left arm at elbow  . MYRINGOTOMY WITH TUBE PLACEMENT Bilateral 11/07/2017   Procedure: BILATERAL MYRINGOTOMY WITH TUBE PLACEMENT;  Surgeon: Newman Pieseoh, Su, MD;  Location: [facility];  Service: ENT;  Laterality: Bilateral;  . TONSILLECTOMY      OB History    Gravida  3   Para  2   Term  2   Preterm      AB  1   Living  1     SAB  1   TAB      Ectopic      Multiple      Live Births  1            Home Medications    Prior to Admission medications   Medication Sig Start Date End Date Taking? Authorizing Provider  albuterol (PROVENTIL HFA;VENTOLIN HFA) 108 (90 BASE) MCG/ACT inhaler Inhale 1-2 puffs into the lungs  every 6 (six) hours as needed for wheezing or shortness of breath. 01/20/14   Palumbo, April, MD  amoxicillin (AMOXIL) 500 MG capsule Take 2 capsules (1,000 mg total) by mouth 3 (three) times daily for 7 days. 05/21/19 05/28/19  Dahlia ByesBast, Dasia Guerrier A, NP  cetirizine (ZYRTEC) 10 MG tablet Take 10 mg by mouth daily.    [provider]  escitalopram (LEXAPRO) 5 MG tablet Take 5 mg by mouth daily.    [provider]  predniSONE (STERAPRED UNI-PAK 21 TAB) 10 MG (21) TBPK tablet Take by mouth daily.    [provider]  promethazine (PHENERGAN) 12.5 MG tablet Take 12.5 mg by mouth every 6 (six) hours as needed for nausea or vomiting.    [provider]    Family History Family History  Problem Relation Age of Onset  . Lupus Mother   . Anxiety disorder Mother   . Cancer Father   . Anxiety disorder Father   . Anxiety disorder Sister   . Diabetes Paternal Grandmother   . Hypertension Paternal Grandmother   .  Hypertension Maternal Grandmother     Social History Social History   Tobacco Use  . Smoking status: Former Smoker    Packs/day: 0.50    Quit date: 11/02/2016    Years since quitting: 2.5  . Smokeless tobacco: Never Used  Substance Use Topics  . Alcohol use: No    Frequency: Never    Comment: "barely"  . Drug use: Yes    Types: Marijuana    Comment: 11/01/17     Allergies   Flexeril [cyclobenzaprine hcl], Penicillins, and Tramadol   Review of Systems Review of Systems   Physical Exam Triage Vital Signs ED Triage Vitals  Enc Vitals Group     BP 05/21/19 1329 116/69     Pulse Rate 05/21/19 1329 85     Resp 05/21/19 1329 17     Temp 05/21/19 1329 98.3 F (36.8 C)     Temp Source 05/21/19 1329 Temporal     SpO2 05/21/19 1329 100 %     Weight --      Height --      Head Circumference --      Peak Flow --      Pain Score 05/21/19 1328 0     Pain Loc --      Pain Edu? --      Excl. in GC? --    No data found.  Updated Vital Signs BP  116/69 (BP Location: Left Arm)   Pulse 85   Temp 98.3 F (36.8 C) (Temporal)   Resp 17   SpO2 100%   Visual Acuity Right Eye Distance:   Left Eye Distance:   Bilateral Distance:    Right Eye Near:   Left Eye Near:    Bilateral Near:     Physical Exam Vitals signs and nursing note reviewed.  Constitutional:      General: She is not in acute distress.    Appearance: Normal appearance. She is not ill-appearing, toxic-appearing or diaphoretic.  HENT:     Head: Normocephalic and atraumatic.     Right Ear: A PE tube is present.     Left Ear: A PE tube is present.     Ears:     Comments: Yellow, blood tinged fluid surrounding  the right tympanotomy tube and inside the tube.  Left TM with tube but otherwise appears normal.  Eyes:     Comments: Bilateral scleral injection  Pulmonary:     Effort: Pulmonary effort is normal.  Neurological:     Mental Status: She is alert.      UC Treatments / Results  Labs (all labs ordered are listed, but only abnormal results are displayed) Labs Reviewed - No data to display  EKG   Radiology No results found.  Procedures Procedures (including critical care time)  Medications Ordered in UC Medications - No data to display  Initial Impression / Assessment and Plan / UC Course  I have reviewed the triage vital signs and the nursing notes.  Pertinent labs & imaging results that were available during my care of the patient were reviewed by me and considered in my medical decision making (see chart for details).     Otalgia of the right ear- there is a possibility that the tube is blocked. I recommend she follow up with ENT.  Based on symptoms, drainage and foul smell we will cover for infection.  Told to continue her allergy medication.  Final Clinical Impressions(s) / UC Diagnoses   Final diagnoses:  Otalgia, right ear     Discharge Instructions     We will go ahead and treat you for an ear infection.  Take the medication  as prescribed.  He can take Tylenol or ibuprofen as needed for pain.  Continue with your allergy daily medication. For continued or worsening symptoms she will need to follow-up with your ear nose and throat specialist.    ED Prescriptions    Medication Sig Dispense Auth. Provider   amoxicillin (AMOXIL) 500 MG capsule Take 2 capsules (1,000 mg total) by mouth 3 (three) times daily for 7 days. 42 capsule Loura Halt A, NP     Controlled Substance Prescriptions [place] Controlled Substance Registry consulted? Not Applicable   Orvan July, NP 05/21/19 1431

## 2019-05-21 NOTE — Discharge Instructions (Signed)
We will go ahead and treat you for an ear infection.  Take the medication as prescribed.  He can take Tylenol or ibuprofen as needed for pain.  Continue with your allergy daily medication. For continued or worsening symptoms she will need to follow-up with your ear nose and throat specialist.

## 2019-05-21 NOTE — ED Notes (Signed)
Provided patient with work note as instructed by Loura Halt, np

## 2019-08-21 ENCOUNTER — Other Ambulatory Visit: Payer: Self-pay

## 2019-08-21 ENCOUNTER — Emergency Department (HOSPITAL_COMMUNITY): Payer: Medicaid Other

## 2019-08-21 ENCOUNTER — Encounter (HOSPITAL_COMMUNITY): Payer: Self-pay | Admitting: Emergency Medicine

## 2019-08-21 ENCOUNTER — Emergency Department (HOSPITAL_COMMUNITY)
Admission: EM | Admit: 2019-08-21 | Discharge: 2019-08-21 | Disposition: A | Discharge status: Home / Self Care | Payer: Medicaid Other | Attending: Emergency Medicine | Admitting: Emergency Medicine

## 2019-08-21 DIAGNOSIS — R112 Nausea with vomiting, unspecified: Secondary | ICD-10-CM

## 2019-08-21 DIAGNOSIS — J029 Acute pharyngitis, unspecified: Secondary | ICD-10-CM | POA: Diagnosis present

## 2019-08-21 DIAGNOSIS — Z20828 Contact with and (suspected) exposure to other viral communicable diseases: Secondary | ICD-10-CM | POA: Insufficient documentation

## 2019-08-21 DIAGNOSIS — R0981 Nasal congestion: Secondary | ICD-10-CM | POA: Diagnosis not present

## 2019-08-21 DIAGNOSIS — R110 Nausea: Secondary | ICD-10-CM | POA: Insufficient documentation

## 2019-08-21 DIAGNOSIS — R05 Cough: Secondary | ICD-10-CM | POA: Insufficient documentation

## 2019-08-21 LAB — CBC
HCT: 44.5 % (ref 36.0–46.0)
Hemoglobin: 15.1 g/dL — ABNORMAL HIGH (ref 12.0–15.0)
MCH: 29.4 pg (ref 26.0–34.0)
MCHC: 33.9 g/dL (ref 30.0–36.0)
MCV: 86.6 fL (ref 80.0–100.0)
Platelets: 387 10*3/uL (ref 150–400)
RBC: 5.14 MIL/uL — ABNORMAL HIGH (ref 3.87–5.11)
RDW: 12.3 % (ref 11.5–15.5)
WBC: 8.1 10*3/uL (ref 4.0–10.5)
nRBC: 0 % (ref 0.0–0.2)

## 2019-08-21 LAB — BASIC METABOLIC PANEL
Anion gap: 10 (ref 5–15)
BUN: 9 mg/dL (ref 6–20)
CO2: 23 mmol/L (ref 22–32)
Calcium: 9.5 mg/dL (ref 8.9–10.3)
Chloride: 104 mmol/L (ref 98–111)
Creatinine, Ser: 0.8 mg/dL (ref 0.44–1.00)
GFR calc Af Amer: >60 mL/min (ref >60)
GFR calc non Af Amer: >60 mL/min (ref >60)
Glucose, Bld: 89 mg/dL (ref 70–99)
Potassium: 3.7 mmol/L (ref 3.5–5.1)
Sodium: 137 mmol/L (ref 135–145)

## 2019-08-21 LAB — GROUP A STREP BY PCR: Group A Strep by PCR: NOT DETECTED

## 2019-08-21 MED ORDER — IBUPROFEN 400 MG PO TABS
600.0000 mg | ORAL_TABLET | Freq: Once | ORAL | Status: AC
  Administered 2019-08-21: 600 mg via ORAL
  Filled 2019-08-21: qty 1

## 2019-08-21 MED ORDER — DIPHENHYDRAMINE HCL 25 MG PO CAPS
25.0000 mg | ORAL_CAPSULE | Freq: Once | ORAL | Status: AC
  Administered 2019-08-21: 25 mg via ORAL
  Filled 2019-08-21: qty 1

## 2019-08-21 MED ORDER — ONDANSETRON 4 MG PO TBDP
4.0000 mg | ORAL_TABLET | Freq: Once | ORAL | Status: AC
Start: 2019-08-21 — End: 2019-08-21
  Administered 2019-08-21: 4 mg via ORAL
  Filled 2019-08-21: qty 1

## 2019-08-21 MED ORDER — LIDOCAINE VISCOUS HCL 2 % MT SOLN
15.0000 mL | Freq: Once | OROMUCOSAL | Status: AC
  Administered 2019-08-21: 15 mL via OROMUCOSAL
  Filled 2019-08-21: qty 15

## 2019-08-21 MED ORDER — ONDANSETRON 4 MG PO TBDP: ORAL_TABLET | ORAL | 0 refills | Status: DC

## 2019-08-21 NOTE — ED Provider Notes (Signed)
[facility] Provider Note   CSN: [id number] Arrival date & time: 08/21/19  1701     History   Chief Complaint No chief complaint on file.   HPI Angela Spencer is a 28 y.o. female.     Angela Spencer is a 28 y.o. female with a history of asthma, migraines, depression and anxiety, who presents to the ED for evaluation of sore throat.  Pain is described as a constant dull ache.  Symptoms started 2 days ago.  She denies associated fevers but has had some chills.  Reports that today she started to have a slight cough, and has noticed some rhinorrhea.  Became nauseated this morning but has not had any vomiting, abdominal pain or diarrhea.  No chest pain or shortness of breath.  No loss of taste or smell.  No known sick contacts.  She has not taken any medications prior to arrival.  No other aggravating or alleviating factors.     Past Medical History:  Diagnosis Date  . 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 Anxiety   . Asthma   . Depression   . Migraine headache   . Multiple allergies   . Psychotic disorder [facility](HCC)     Patient Active Problem List   Diagnosis Date Noted  . Psychotic disorder (HCC) 07/13/2014  . Anxiety disorder 07/12/2014    Past Surgical History:  Procedure Laterality Date  . ADENOIDECTOMY    . arm surgery     broken left arm at elbow  . MYRINGOTOMY WITH TUBE PLACEMENT Bilateral 11/07/2017   Procedure: BILATERAL MYRINGOTOMY WITH TUBE PLACEMENT;  Surgeon: Newman Pieseoh, Su, MD;  Location: [facility];  Service: ENT;  Laterality: Bilateral;  . TONSILLECTOMY       OB History    Gravida  3   Para  2   Term  2   Preterm      AB  1   Living  1     SAB  1   TAB      Ectopic      Multiple      Live Births  1            Home Medications    Prior to Admission medications   Medication Sig Start Date End Date Taking? Authorizing Provider  albuterol  (PROVENTIL HFA;VENTOLIN HFA) 108 (90 BASE) MCG/ACT inhaler Inhale 1-2 puffs into the lungs every 6 (six) hours as needed for wheezing or shortness of breath. 01/20/14   Palumbo, April, MD  cetirizine (ZYRTEC) 10 MG tablet Take 10 mg by mouth daily.    [provider]  escitalopram (LEXAPRO) 5 MG tablet Take 5 mg by mouth daily.    [provider]  ondansetron (ZOFRAN ODT) 4 MG disintegrating tablet 4mg  ODT q4 hours prn nausea/vomit 08/21/19   Dartha LodgeFord, Lynore Coscia N, PA-C  predniSONE (STERAPRED UNI-PAK 21 TAB) 10 MG (21) TBPK tablet Take by mouth daily.    [provider]  promethazine (PHENERGAN) 12.5 MG tablet Take 12.5 mg by mouth every 6 (six) hours as needed for nausea or vomiting.    [provider]    Family History Family History  Problem Relation Age of Onset  . Lupus Mother   . Anxiety disorder Mother   . Cancer Father   . Anxiety disorder Father   . Anxiety disorder Sister   . Diabetes Paternal Grandmother   .  Hypertension Paternal Grandmother   . Hypertension Maternal Grandmother     Social History Social History   Tobacco Use  . Smoking status: Former Smoker    Packs/day: 0.50    Quit date: 11/02/2016    Years since quitting: 2.8  . Smokeless tobacco: Never Used  Substance Use Topics  . Alcohol use: No    Frequency: Never    Comment: "barely"  . Drug use: Yes    Types: Marijuana    Comment: 11/01/17     Allergies   Flexeril [cyclobenzaprine hcl], Penicillins, and Tramadol   Review of Systems Review of Systems  Constitutional: Positive for chills. Negative for fever.  HENT: Positive for congestion, rhinorrhea and sore throat. Negative for ear pain and trouble swallowing.   Respiratory: Positive for cough. Negative for shortness of breath.   Cardiovascular: Negative for chest pain.  Gastrointestinal: Positive for nausea. Negative for abdominal pain, diarrhea and vomiting.  Musculoskeletal: Negative for myalgias.  Skin: Negative  for color change and rash.  Neurological: Negative for headaches.  All other systems reviewed and are negative.    Physical Exam Updated Vital Signs BP 119/75 (BP Location: Right Arm)   Pulse (!) 58   Temp 98.1 F (36.7 C) (Oral)   Resp 16   LMP 07/28/2019   SpO2 99%   Physical Exam Vitals signs and nursing note reviewed.  Constitutional:      General: She is not in acute distress.    Appearance: Normal appearance. She is well-developed. She is not ill-appearing or diaphoretic.  HENT:     Head: Normocephalic and atraumatic.     Nose: Rhinorrhea present.     Mouth/Throat:     Mouth: Mucous membranes are moist.     Pharynx: Posterior oropharyngeal erythema present. No oropharyngeal exudate.     Comments: Posterior oropharynx clear and mucous membranes moist, there is mild erythema but no edema or tonsillar exudates, uvula midline, normal phonation, no trismus, tolerating secretions without difficulty. Eyes:     General:        Right eye: No discharge.        Left eye: No discharge.  Neck:     Musculoskeletal: Neck supple.     Comments: No rigidity Cardiovascular:     Rate and Rhythm: Normal rate and regular rhythm.     Heart sounds: Normal heart sounds.  Pulmonary:     Effort: Pulmonary effort is normal. No respiratory distress.     Breath sounds: Normal breath sounds.     Comments: Respirations equal and unlabored, patient able to speak in full sentences, lungs clear to auscultation bilaterally Abdominal:     General: Bowel sounds are normal. There is no distension.     Palpations: Abdomen is soft. There is no mass.     Tenderness: There is no abdominal tenderness. There is no guarding.     Comments: Abdomen soft, nondistended, nontender to palpation in all quadrants without guarding or peritoneal signs  Musculoskeletal:        General: No deformity.  Lymphadenopathy:     Cervical: No cervical adenopathy.  Skin:    General: Skin is warm and dry.     Capillary  Refill: Capillary refill takes less than 2 seconds.  Neurological:     Mental Status: She is alert and oriented to person, place, and time.  Psychiatric:        Mood and Affect: Mood normal.        Behavior: Behavior normal.  ED Treatments / Results  Labs (all labs ordered are listed, but only abnormal results are displayed) Labs Reviewed  CBC - Abnormal; Notable for the following components:      Result Value   RBC 5.14 (*)    Hemoglobin 15.1 (*)    All other components within normal limits  GROUP A STREP BY PCR  SARS CORONAVIRUS 2 (TAT 6-24 HRS)  BASIC METABOLIC PANEL    EKG None  Radiology Dg Chest 2 View  Result Date: 08/21/2019 CLINICAL DATA:  Cough EXAM: CHEST - 2 VIEW COMPARISON:  06/21/2014 FINDINGS: The heart size and mediastinal contours are within normal limits. Both lungs are clear. The visualized skeletal structures are unremarkable. IMPRESSION: No active cardiopulmonary disease. Electronically Signed   By: Donavan Foil M.D.   On: 08/21/2019 17:48    Procedures Procedures (including critical care time)  Medications Ordered in ED Medications  ondansetron (ZOFRAN-ODT) disintegrating tablet 4 mg (4 mg Oral Given 08/21/19 2120)  ibuprofen (ADVIL) tablet 600 mg (600 mg Oral Given 08/21/19 2120)  lidocaine (XYLOCAINE) 2 % viscous mouth solution 15 mL (15 mLs Mouth/Throat Given 08/21/19 2120)  diphenhydrAMINE (BENADRYL) capsule 25 mg (25 mg Oral Given 08/21/19 2133)     Initial Impression / Assessment and Plan / ED Course  I have reviewed the triage vital signs and the nursing notes.  Pertinent labs & imaging results that were available during my care of the patient were reviewed by me and considered in my medical decision making (see chart for details).  Pt afebrile without tonsillar exudate, negative strep. Presents with mild cervical lymphadenopathy, & dysphagia; diagnosis of viral pharyngitis. Basic labs ordered from triage which were unremarkable.  CXR is clear. COVID swab sent. No abx indicated. Discharged with symptomatic tx for pain  Pt does not appear dehydrated, but did discuss importance of water rehydration. Presentation non concerning for PTA or RPA. No trismus or uvula deviation. Specific return precautions discussed. Pt able to drink water in ED without difficulty with intact air way. Recommended PCP follow up.   Angela Spencer was evaluated in Emergency Department on 08/21/2019 for the symptoms described in the history of present illness. She was evaluated in the context of the global COVID-19 pandemic, which necessitated consideration that the patient might be at risk for infection with the SARS-CoV-2 virus that causes COVID-19. Institutional protocols and algorithms that pertain to the evaluation of patients at risk for COVID-19 are in a state of rapid change based on information released by regulatory bodies including the CDC and federal and state organizations. These policies and algorithms were followed during the patient's care in the ED.  Final Clinical Impressions(s) / ED Diagnoses   Final diagnoses:  Sore throat  Non-intractable vomiting with nausea, unspecified vomiting type    ED Discharge Orders         Ordered    ondansetron (ZOFRAN ODT) 4 MG disintegrating tablet     08/21/19 2238           Jacqlyn Larsen, [place] 08/24/19 2027    Deno Etienne, DO 08/24/19 2303

## 2019-08-21 NOTE — ED Notes (Signed)
Patient Alert and oriented to baseline. Stable and ambulatory to baseline. Patient verbalized understanding of the discharge instructions.  Patient belongings were taken by the patient.   

## 2019-08-21 NOTE — ED Triage Notes (Signed)
Pt here with c/o sore throat , no fevers, slight cough , with some nausea ,

## 2019-08-21 NOTE — Discharge Instructions (Signed)
Your work-up today has been reassuring, lab work looks good and strep test was negative.  I think your symptoms are likely due to a viral syndrome.  You do have a coronavirus test pending this should be back in 1 to 2 days, you will be called by phone if your results are positive and you can also see your results online.  Please quarantine at home until you get these results.  Use Zofran as needed for nausea, ibuprofen and Tylenol as needed for pain.  You can also use topical throat lozenges.  Follow-up with your PCP.  Return to the ED for any new or worsening symptoms.

## 2019-08-22 LAB — SARS CORONAVIRUS 2 (TAT 6-24 HRS): SARS Coronavirus 2: NEGATIVE

## 2019-11-16 ENCOUNTER — Encounter (HOSPITAL_COMMUNITY): Payer: Self-pay

## 2019-11-16 ENCOUNTER — Ambulatory Visit (HOSPITAL_COMMUNITY)
Admission: EM | Admit: 2019-11-16 | Discharge: 2019-11-16 | Disposition: A | Discharge status: Home / Self Care | Payer: Medicaid Other | Attending: Family Medicine | Admitting: Family Medicine

## 2019-11-16 ENCOUNTER — Other Ambulatory Visit: Payer: Self-pay

## 2019-11-16 DIAGNOSIS — B349 Viral infection, unspecified: Secondary | ICD-10-CM | POA: Diagnosis not present

## 2019-11-16 MED ORDER — ONDANSETRON HCL 8 MG PO TABS: 8.0000 mg | ORAL_TABLET | Freq: Three times a day (TID) | ORAL | 0 refills | Status: DC | PRN

## 2019-11-16 NOTE — ED Triage Notes (Signed)
Pt reports having chills, body aches, fever, vomiting, no smell, no taste x 5 days. Pt reports she had a COVID test done on 4 days ago, and she got the negative results 2 days ago.
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
## 2019-11-16 NOTE — Discharge Instructions (Signed)
Take the medicine for nausea Plenty of clear liquids Add bland diet as tolerated

## 2019-12-12 ENCOUNTER — Emergency Department (HOSPITAL_COMMUNITY): Payer: Medicaid Other

## 2019-12-12 ENCOUNTER — Other Ambulatory Visit: Payer: Self-pay

## 2019-12-12 ENCOUNTER — Emergency Department (HOSPITAL_BASED_OUTPATIENT_CLINIC_OR_DEPARTMENT_OTHER): Payer: Medicaid Other

## 2019-12-12 ENCOUNTER — Encounter (HOSPITAL_BASED_OUTPATIENT_CLINIC_OR_DEPARTMENT_OTHER): Payer: Self-pay | Admitting: Emergency Medicine

## 2019-12-12 ENCOUNTER — Emergency Department (HOSPITAL_BASED_OUTPATIENT_CLINIC_OR_DEPARTMENT_OTHER)
Admission: EM | Admit: 2019-12-12 | Discharge: 2019-12-12 | Disposition: A | Discharge status: Home / Self Care | Payer: Medicaid Other | Attending: Emergency Medicine | Admitting: Emergency Medicine

## 2019-12-12 DIAGNOSIS — Z87891 Personal history of nicotine dependence: Secondary | ICD-10-CM | POA: Diagnosis not present

## 2019-12-12 DIAGNOSIS — R32 Unspecified urinary incontinence: Secondary | ICD-10-CM | POA: Insufficient documentation

## 2019-12-12 DIAGNOSIS — M7918 Myalgia, other site: Secondary | ICD-10-CM | POA: Diagnosis present

## 2019-12-12 DIAGNOSIS — R519 Headache, unspecified: Secondary | ICD-10-CM | POA: Diagnosis not present

## 2019-12-12 DIAGNOSIS — N3942 Incontinence without sensory awareness: Secondary | ICD-10-CM | POA: Diagnosis not present

## 2019-12-12 DIAGNOSIS — Z20822 Contact with and (suspected) exposure to covid-19: Secondary | ICD-10-CM | POA: Diagnosis not present

## 2019-12-12 DIAGNOSIS — Z79899 Other long term (current) drug therapy: Secondary | ICD-10-CM | POA: Diagnosis not present

## 2019-12-12 LAB — URINALYSIS, ROUTINE W REFLEX MICROSCOPIC
Bilirubin Urine: NEGATIVE
Glucose, UA: NEGATIVE mg/dL
Ketones, ur: NEGATIVE mg/dL
Leukocytes,Ua: NEGATIVE
Nitrite: NEGATIVE
Protein, ur: NEGATIVE mg/dL
Specific Gravity, Urine: >1.03 — ABNORMAL HIGH (ref 1.005–1.030)
pH: 6 (ref 5.0–8.0)

## 2019-12-12 LAB — URINALYSIS, MICROSCOPIC (REFLEX)

## 2019-12-12 LAB — PREGNANCY, URINE: Preg Test, Ur: NEGATIVE

## 2019-12-12 LAB — SARS CORONAVIRUS 2 AG (30 MIN TAT): SARS Coronavirus 2 Ag: NEGATIVE

## 2019-12-12 MED ORDER — NAPROXEN 500 MG PO TABS: 500.0000 mg | ORAL_TABLET | Freq: Two times a day (BID) | ORAL | 0 refills | Status: DC

## 2019-12-12 MED ORDER — METHOCARBAMOL 500 MG PO TABS: 500.0000 mg | ORAL_TABLET | Freq: Two times a day (BID) | ORAL | 0 refills | Status: DC

## 2019-12-12 NOTE — ED Notes (Signed)
Patient given discharge instructions patient verbalizes understanding. 

## 2019-12-12 NOTE — ED Notes (Signed)
One order left unacknowledged for the Charge nurse to see the order.

## 2019-12-12 NOTE — ED Notes (Signed)
Report given to [place], ED Press photographer.    I called him back to inform him that patient need a cab to come back to [facility] ED to pick up her vehicle. Message left to Sarah.

## 2019-12-12 NOTE — Discharge Planning (Signed)
RNCM provided taxi voucher for transportation to [facility] to pick up car.

## 2019-12-12 NOTE — ED Notes (Signed)
Pt denies the foley catheter after much encouragement from the PA. Pt states she plans to follow up with urology this week.
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
## 2019-12-12 NOTE — Discharge Instructions (Addendum)
Please follow up with [facility] for further evaluation of your urinary incontinence. Please keep foley catheter in until you are seen by them.   Follow up with your OBGYN as well given your complaints today.   Take medication as prescribed for pain. DO NOT DRIVE WHILE ON THE MUSCLE RELAXERS AS THEY CAN MAKE YOU DROWSY.   Return to the ED for any worsening symptoms

## 2019-12-12 NOTE — ED Notes (Signed)
Pt transported to MRI 
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
## 2019-12-12 NOTE — ED Notes (Signed)
Bladder scan was 94 ml
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
## 2020-01-24 ENCOUNTER — Encounter: Payer: Self-pay | Admitting: *Deleted

## 2020-01-28 ENCOUNTER — Telehealth: Payer: Self-pay | Admitting: *Deleted

## 2020-01-28 ENCOUNTER — Encounter: Payer: Self-pay | Admitting: Diagnostic Neuroimaging

## 2020-01-28 ENCOUNTER — Ambulatory Visit: Payer: Medicaid Other | Admitting: Diagnostic Neuroimaging

## 2020-01-28 NOTE — Telephone Encounter (Signed)
Patient was no show for new patient appointment today. 

## 2020-01-30 ENCOUNTER — Telehealth: Payer: Self-pay | Admitting: Diagnostic Neuroimaging

## 2020-01-30 NOTE — Telephone Encounter (Signed)
Pt arrived in the lobby this morning wrong day for apt. Would like a call back from nurse regarding sooner apt than what is available in May. Best call back [phone]

## 2020-04-30 ENCOUNTER — Ambulatory Visit: Payer: Medicaid Other | Admitting: Urology

## 2020-07-22 ENCOUNTER — Other Ambulatory Visit: Payer: Self-pay

## 2020-07-22 ENCOUNTER — Emergency Department (HOSPITAL_BASED_OUTPATIENT_CLINIC_OR_DEPARTMENT_OTHER)
Admission: EM | Admit: 2020-07-22 | Discharge: 2020-07-22 | Disposition: A | Discharge status: Home / Self Care | Payer: Medicaid Other | Attending: Emergency Medicine | Admitting: Emergency Medicine

## 2020-07-22 ENCOUNTER — Encounter (HOSPITAL_BASED_OUTPATIENT_CLINIC_OR_DEPARTMENT_OTHER): Payer: Self-pay | Admitting: Emergency Medicine

## 2020-07-22 ENCOUNTER — Emergency Department (HOSPITAL_BASED_OUTPATIENT_CLINIC_OR_DEPARTMENT_OTHER): Payer: Medicaid Other

## 2020-07-22 DIAGNOSIS — J45909 Unspecified asthma, uncomplicated: Secondary | ICD-10-CM | POA: Insufficient documentation

## 2020-07-22 DIAGNOSIS — M62838 Other muscle spasm: Secondary | ICD-10-CM | POA: Diagnosis not present

## 2020-07-22 DIAGNOSIS — Z87891 Personal history of nicotine dependence: Secondary | ICD-10-CM | POA: Diagnosis not present

## 2020-07-22 DIAGNOSIS — M542 Cervicalgia: Secondary | ICD-10-CM | POA: Diagnosis present

## 2020-07-22 MED ORDER — METHOCARBAMOL 500 MG PO TABS
1000.0000 mg | ORAL_TABLET | Freq: Once | ORAL | Status: AC
  Administered 2020-07-22: 1000 mg via ORAL
  Filled 2020-07-22: qty 2

## 2020-07-22 MED ORDER — METHOCARBAMOL 500 MG PO TABS: 500.0000 mg | ORAL_TABLET | Freq: Two times a day (BID) | ORAL | 0 refills | Status: DC | PRN

## 2020-07-22 MED ORDER — KETOROLAC TROMETHAMINE 60 MG/2ML IM SOLN
30.0000 mg | Freq: Once | INTRAMUSCULAR | Status: AC
  Administered 2020-07-22: 30 mg via INTRAMUSCULAR
  Filled 2020-07-22: qty 2
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
## 2020-07-22 NOTE — ED Triage Notes (Signed)
Pt arrives pov with family with c/o right side neck and upper back pain x 3 days. Pt denies injury, deneis numbness or tingling. Pt ambulatory to triage. Pt endorses 400 mg ibuprofen at 0830 today with no relief.

## 2020-11-10 ENCOUNTER — Emergency Department (HOSPITAL_COMMUNITY)
Admission: EM | Admit: 2020-11-10 | Discharge: 2020-11-11 | Disposition: A | Discharge status: Home / Self Care | Payer: Medicaid Other | Attending: Emergency Medicine | Admitting: Emergency Medicine

## 2020-11-10 ENCOUNTER — Encounter (HOSPITAL_COMMUNITY): Payer: Self-pay | Admitting: *Deleted

## 2020-11-10 ENCOUNTER — Other Ambulatory Visit: Payer: Self-pay

## 2020-11-10 DIAGNOSIS — Z5321 Procedure and treatment not carried out due to patient leaving prior to being seen by health care provider: Secondary | ICD-10-CM | POA: Diagnosis not present

## 2020-11-10 DIAGNOSIS — G43909 Migraine, unspecified, not intractable, without status migrainosus: Secondary | ICD-10-CM | POA: Diagnosis present

## 2020-11-10 MED ORDER — OXYCODONE-ACETAMINOPHEN 5-325 MG PO TABS
1.0000 | ORAL_TABLET | ORAL | Status: DC | PRN
  Administered 2020-11-10: 1 via ORAL
  Filled 2020-11-10: qty 1
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
## 2020-11-10 NOTE — ED Notes (Signed)
Patient states she will follow up with her neurologist d/t wait time. Explained process of wait time versus acuity. Encouraged patient to stay. LWBS

## 2020-11-11 ENCOUNTER — Emergency Department (HOSPITAL_COMMUNITY)
Admission: EM | Admit: 2020-11-11 | Discharge: 2020-11-11 | Disposition: A | Discharge status: Home / Self Care | Payer: Medicaid Other | Attending: Emergency Medicine | Admitting: Emergency Medicine

## 2020-11-11 ENCOUNTER — Emergency Department (HOSPITAL_COMMUNITY): Payer: Medicaid Other

## 2020-11-11 ENCOUNTER — Other Ambulatory Visit: Payer: Self-pay

## 2020-11-11 ENCOUNTER — Encounter (HOSPITAL_COMMUNITY): Payer: Self-pay | Admitting: Radiology

## 2020-11-11 DIAGNOSIS — R112 Nausea with vomiting, unspecified: Secondary | ICD-10-CM | POA: Diagnosis not present

## 2020-11-11 DIAGNOSIS — Z87891 Personal history of nicotine dependence: Secondary | ICD-10-CM | POA: Diagnosis not present

## 2020-11-11 DIAGNOSIS — H53149 Visual discomfort, unspecified: Secondary | ICD-10-CM | POA: Insufficient documentation

## 2020-11-11 DIAGNOSIS — R519 Headache, unspecified: Secondary | ICD-10-CM

## 2020-11-11 DIAGNOSIS — I639 Cerebral infarction, unspecified: Secondary | ICD-10-CM

## 2020-11-11 DIAGNOSIS — J45909 Unspecified asthma, uncomplicated: Secondary | ICD-10-CM | POA: Insufficient documentation

## 2020-11-11 HISTORY — DX: Cerebral infarction, unspecified: I63.9

## 2020-11-11 LAB — CBC WITH DIFFERENTIAL/PLATELET
Abs Immature Granulocytes: 0.05 10*3/uL (ref 0.00–0.07)
Basophils Absolute: 0 10*3/uL (ref 0.0–0.1)
Basophils Relative: 0 %
Eosinophils Absolute: 0 10*3/uL (ref 0.0–0.5)
Eosinophils Relative: 0 %
HCT: 42.3 % (ref 36.0–46.0)
Hemoglobin: 14.5 g/dL (ref 12.0–15.0)
Immature Granulocytes: 0 %
Lymphocytes Relative: 9 %
Lymphs Abs: 1.4 10*3/uL (ref 0.7–4.0)
MCH: 30 pg (ref 26.0–34.0)
MCHC: 34.3 g/dL (ref 30.0–36.0)
MCV: 87.4 fL (ref 80.0–100.0)
Monocytes Absolute: 1.3 10*3/uL — ABNORMAL HIGH (ref 0.1–1.0)
Monocytes Relative: 9 %
Neutro Abs: 11.9 10*3/uL — ABNORMAL HIGH (ref 1.7–7.7)
Neutrophils Relative %: 82 %
Platelets: 391 10*3/uL (ref 150–400)
RBC: 4.84 MIL/uL (ref 3.87–5.11)
RDW: 12.6 % (ref 11.5–15.5)
WBC: 14.6 10*3/uL — ABNORMAL HIGH (ref 4.0–10.5)
nRBC: 0 % (ref 0.0–0.2)

## 2020-11-11 LAB — BASIC METABOLIC PANEL
Anion gap: 12 (ref 5–15)
BUN: 11 mg/dL (ref 6–20)
CO2: 21 mmol/L — ABNORMAL LOW (ref 22–32)
Calcium: 9.5 mg/dL (ref 8.9–10.3)
Chloride: 106 mmol/L (ref 98–111)
Creatinine, Ser: 0.82 mg/dL (ref 0.44–1.00)
GFR, Estimated: >60 mL/min (ref >60)
Glucose, Bld: 104 mg/dL — ABNORMAL HIGH (ref 70–99)
Potassium: 3.4 mmol/L — ABNORMAL LOW (ref 3.5–5.1)
Sodium: 139 mmol/L (ref 135–145)

## 2020-11-11 MED ORDER — KETOROLAC TROMETHAMINE 15 MG/ML IJ SOLN
15.0000 mg | Freq: Once | INTRAMUSCULAR | Status: AC
  Administered 2020-11-11: 15 mg via INTRAVENOUS
  Filled 2020-11-11: qty 1

## 2020-11-11 MED ORDER — PROCHLORPERAZINE EDISYLATE 10 MG/2ML IJ SOLN
10.0000 mg | Freq: Once | INTRAMUSCULAR | Status: AC
  Administered 2020-11-11: 10 mg via INTRAVENOUS
  Filled 2020-11-11: qty 2

## 2020-11-11 MED ORDER — DIPHENHYDRAMINE HCL 50 MG/ML IJ SOLN
25.0000 mg | Freq: Once | INTRAMUSCULAR | Status: AC
  Administered 2020-11-11: 25 mg via INTRAVENOUS
  Filled 2020-11-11: qty 1

## 2020-11-11 MED ORDER — DEXAMETHASONE SODIUM PHOSPHATE 10 MG/ML IJ SOLN
10.0000 mg | Freq: Once | INTRAMUSCULAR | Status: AC
  Administered 2020-11-11: 10 mg via INTRAVENOUS
  Filled 2020-11-11: qty 1

## 2020-11-11 MED ORDER — SODIUM CHLORIDE 0.9 % IV BOLUS
1000.0000 mL | Freq: Once | INTRAVENOUS | Status: AC
  Administered 2020-11-11: 1000 mL via INTRAVENOUS

## 2020-11-11 NOTE — ED Notes (Signed)
Patient refused for me to get her vitals she stated "she just wants a room"

## 2020-11-11 NOTE — ED Notes (Signed)
Pt sleeping, chest rising and falling, pt in NAD 

## 2020-11-11 NOTE — Discharge Instructions (Signed)
Like we discussed, please follow-up with your neurologist.  If you develop new or worsening symptoms, you can always return to the emergency department.  It was a pleasure to meet you.

## 2020-11-11 NOTE — ED Notes (Signed)
This writer was unable to obtain an accurate set of vital signs due to patient screaming and crying stating that she cannot be still. Unable to obtain a temperature on patient. When patient was escorted to the lobby patient began to scream louder.
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
## 2020-11-11 NOTE — ED Notes (Signed)
Pt ambulated to restroom without assistance, steady gait.

## 2021-02-08 ENCOUNTER — Emergency Department (HOSPITAL_BASED_OUTPATIENT_CLINIC_OR_DEPARTMENT_OTHER): Payer: Medicaid Other

## 2021-02-08 ENCOUNTER — Emergency Department (HOSPITAL_BASED_OUTPATIENT_CLINIC_OR_DEPARTMENT_OTHER)
Admission: EM | Admit: 2021-02-08 | Discharge: 2021-02-08 | Disposition: A | Discharge status: Home / Self Care | Payer: Medicaid Other | Attending: Emergency Medicine | Admitting: Emergency Medicine

## 2021-02-08 ENCOUNTER — Encounter (HOSPITAL_BASED_OUTPATIENT_CLINIC_OR_DEPARTMENT_OTHER): Payer: Self-pay | Admitting: Emergency Medicine

## 2021-02-08 ENCOUNTER — Other Ambulatory Visit: Payer: Self-pay

## 2021-02-08 DIAGNOSIS — J45909 Unspecified asthma, uncomplicated: Secondary | ICD-10-CM | POA: Diagnosis not present

## 2021-02-08 DIAGNOSIS — J3489 Other specified disorders of nose and nasal sinuses: Secondary | ICD-10-CM | POA: Insufficient documentation

## 2021-02-08 DIAGNOSIS — Z20822 Contact with and (suspected) exposure to covid-19: Secondary | ICD-10-CM | POA: Diagnosis not present

## 2021-02-08 DIAGNOSIS — Z28310 Unvaccinated for covid-19: Secondary | ICD-10-CM | POA: Insufficient documentation

## 2021-02-08 DIAGNOSIS — Z87891 Personal history of nicotine dependence: Secondary | ICD-10-CM | POA: Diagnosis not present

## 2021-02-08 DIAGNOSIS — R0602 Shortness of breath: Secondary | ICD-10-CM | POA: Insufficient documentation

## 2021-02-08 DIAGNOSIS — J189 Pneumonia, unspecified organism: Secondary | ICD-10-CM

## 2021-02-08 DIAGNOSIS — J029 Acute pharyngitis, unspecified: Secondary | ICD-10-CM | POA: Insufficient documentation

## 2021-02-08 HISTORY — DX: Unspecified convulsions: R56.9

## 2021-02-08 LAB — RESP PANEL BY RT-PCR (FLU A&B, COVID) ARPGX2
Influenza A by PCR: NEGATIVE
Influenza B by PCR: NEGATIVE
SARS Coronavirus 2 by RT PCR: NEGATIVE

## 2021-02-08 MED ORDER — AZITHROMYCIN 250 MG PO TABS
500.0000 mg | ORAL_TABLET | Freq: Once | ORAL | Status: AC
  Administered 2021-02-08: 500 mg via ORAL
  Filled 2021-02-08: qty 2

## 2021-02-08 MED ORDER — AZITHROMYCIN 250 MG PO TABS: 250.0000 mg | ORAL_TABLET | Freq: Every day | ORAL | 0 refills | Status: DC

## 2021-02-08 NOTE — ED Triage Notes (Signed)
Reports "asthma flare" x 3 days. Intermittent SHOB (none presently), cough, stuffy/runny nose and sore throat. Pt is not vaccinated against Covid. Works in assisted living facility.
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
## 2021-10-10 ENCOUNTER — Other Ambulatory Visit: Payer: Self-pay

## 2021-10-10 ENCOUNTER — Ambulatory Visit (HOSPITAL_COMMUNITY)
Admission: EM | Admit: 2021-10-10 | Discharge: 2021-10-10 | Disposition: A | Discharge status: Home / Self Care | Payer: Medicaid Other

## 2021-10-10 ENCOUNTER — Encounter (HOSPITAL_COMMUNITY): Payer: Self-pay | Admitting: *Deleted

## 2021-10-10 DIAGNOSIS — R519 Headache, unspecified: Secondary | ICD-10-CM | POA: Diagnosis not present

## 2021-10-10 DIAGNOSIS — R42 Dizziness and giddiness: Secondary | ICD-10-CM | POA: Diagnosis not present
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
## 2021-10-10 NOTE — Discharge Instructions (Signed)
You need to go to the emergency room for further evaluation and possible head imaging.  Please go directly to emergency room as we discussed.

## 2021-10-28 DIAGNOSIS — R569 Unspecified convulsions: Secondary | ICD-10-CM | POA: Insufficient documentation

## 2022-05-11 HISTORY — PX: TRANSFUSION BLOOD OR BLOOD COMPONENTS: SHX7649

## 2022-08-02 ENCOUNTER — Ambulatory Visit: Payer: Medicaid Other | Admitting: Internal Medicine

## 2022-08-02 ENCOUNTER — Encounter: Payer: Self-pay | Admitting: Internal Medicine

## 2022-08-02 VITALS — BP 110/80 | HR 83 | Temp 97.9°F | Resp 18 | Ht 63.5 in | Wt 183.4 lb

## 2022-08-02 DIAGNOSIS — L2089 Other atopic dermatitis: Secondary | ICD-10-CM | POA: Diagnosis not present

## 2022-08-02 DIAGNOSIS — H5789 Other specified disorders of eye and adnexa: Secondary | ICD-10-CM

## 2022-08-02 DIAGNOSIS — J4530 Mild persistent asthma, uncomplicated: Secondary | ICD-10-CM

## 2022-08-02 DIAGNOSIS — J310 Chronic rhinitis: Secondary | ICD-10-CM

## 2022-08-02 DIAGNOSIS — R21 Rash and other nonspecific skin eruption: Secondary | ICD-10-CM | POA: Diagnosis not present

## 2022-08-02 DIAGNOSIS — H1013 Acute atopic conjunctivitis, bilateral: Secondary | ICD-10-CM

## 2022-08-02 MED ORDER — HYDROCORTISONE 2.5 % EX CREA: TOPICAL_CREAM | CUTANEOUS | 1 refills | Status: DC

## 2022-08-02 MED ORDER — EUCRISA 2 % EX OINT: TOPICAL_OINTMENT | CUTANEOUS | 5 refills | Status: DC

## 2022-08-02 MED ORDER — FLUTICASONE PROPIONATE 50 MCG/ACT NA SUSP: 2.0000 | Freq: Every day | NASAL | 3 refills | Status: DC

## 2022-08-02 MED ORDER — ALBUTEROL SULFATE HFA 108 (90 BASE) MCG/ACT IN AERS: 1.0000 | INHALATION_SPRAY | Freq: Four times a day (QID) | RESPIRATORY_TRACT | 1 refills | Status: DC | PRN

## 2022-08-02 MED ORDER — MONTELUKAST SODIUM 10 MG PO TABS: 10.0000 mg | ORAL_TABLET | Freq: Every day | ORAL | 3 refills | Status: DC

## 2022-08-02 MED ORDER — TRIAMCINOLONE ACETONIDE 0.1 % EX OINT: 1.0000 | TOPICAL_OINTMENT | Freq: Two times a day (BID) | CUTANEOUS | 3 refills | Status: DC

## 2022-08-02 NOTE — Patient Instructions (Addendum)
Return in about 1 week (around 08/09/2022).    Rash around Eyes - Use water to cleanse or cetaphil gentle cleanser. No Dial soap! - Don't use any other products on your face.  If they feel really dry, use some Cerave cream.   - We will refer you to a dermatologist and ophthalmologist.    Eczema: - Do a daily soaking tub bath in warm water for 10-15 minutes.  - Use a gentle, unscented cleanser at the end of the bath (such as Dove unscented bar or baby wash, or Aveeno sensitive body wash). Then rinse, pat half-way dry, and apply a gentle, unscented moisturizer cream (Cerave cream) or ointment all over while still damp. Dry skin makes the itching and rash of eczema worse. The skin should be moisturized with a gentle, unscented moisturizer at least twice daily.  - Use only unscented liquid laundry detergent. - Apply prescribed topical steroid (triamcinolone 0.1% below neck or hydrocortisone 2.5% above neck) to flared areas (red and thickened eczema) after the moisturizer has soaked into the skin (wait at least 30 minutes). Taper off the topical steroids as the skin improves. Do not use topical steroid for more than 7-10 days at a time.  - Put Eucrisa onto areas of rough eczema (that is not red) twice a day. May decrease to once a day as the eczema improves. This will not thin the skin, and is safe for chronic use. Do not put this onto normal appearing skin.   Rhinitis: - Hold all anti-histamines before next visit.   - Use nasal saline rinses before nose sprays such as with Neilmed Sinus Rinse.  Use distilled water.   - Use Flonase 2 sprays each nostril daily. Aim upward and outward. - Use Singulair 10mg  daily. - Consider allergy shots as long term control of your symptoms by teaching your immune system to be more tolerant of your allergy triggers  Asthma: - Maintenance: Singulair 10mg  daily - Rescue inhaler: Albuterol 2 puffs via spacer or 1 vial via nebulizer every 4-6 hours as needed for  respiratory symptoms of cough, shortness of breath, or wheezing. Keep track of how often you are requiring it.  Asthma control goals:  Full participation in all desired activities (may need albuterol before activity) Albuterol use two times or less a week on average (not counting use with activity) Cough interfering with sleep two times or less a month Oral steroids no more than once a year No hospitalizations
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
## 2022-08-03 MED ORDER — ALBUTEROL SULFATE HFA 108 (90 BASE) MCG/ACT IN AERS: 1.0000 | INHALATION_SPRAY | Freq: Four times a day (QID) | RESPIRATORY_TRACT | 1 refills | Status: DC | PRN

## 2022-08-03 NOTE — Addendum Note (Signed)
Addended byHarlon Flor on: 08/03/2022 12:30 PM   Modules accepted: Orders

## 2022-08-09 ENCOUNTER — Ambulatory Visit: Payer: Medicaid Other | Admitting: Internal Medicine

## 2022-08-18 ENCOUNTER — Telehealth: Payer: Self-pay

## 2022-08-18 NOTE — Telephone Encounter (Signed)
-----   Message from Birder Robson, MD sent at 08/02/2022  5:09 PM EDT ----- Hello,  Could you please help set up a referral to Ophthalmology and Dermatology?  Referral orders are in.

## 2022-08-19 ENCOUNTER — Ambulatory Visit: Payer: Medicaid Other | Admitting: Dermatology

## 2022-08-19 DIAGNOSIS — L2081 Atopic neurodermatitis: Secondary | ICD-10-CM | POA: Diagnosis not present

## 2022-08-19 DIAGNOSIS — L819 Disorder of pigmentation, unspecified: Secondary | ICD-10-CM

## 2022-08-19 DIAGNOSIS — Z9109 Other allergy status, other than to drugs and biological substances: Secondary | ICD-10-CM

## 2022-08-19 DIAGNOSIS — R609 Edema, unspecified: Secondary | ICD-10-CM

## 2022-08-19 DIAGNOSIS — T148XXA Other injury of unspecified body region, initial encounter: Secondary | ICD-10-CM | POA: Diagnosis not present

## 2022-08-19 MED ORDER — DUPIXENT 300 MG/2ML ~~LOC~~ SOSY: 600.0000 mg | PREFILLED_SYRINGE | Freq: Once | SUBCUTANEOUS | 0 refills | Status: AC

## 2022-08-19 MED ORDER — TACROLIMUS 0.1 % EX OINT: TOPICAL_OINTMENT | Freq: Two times a day (BID) | CUTANEOUS | 0 refills | Status: DC

## 2022-08-19 NOTE — Patient Instructions (Signed)
Due to recent changes in healthcare laws, you may see results of your pathology and/or laboratory studies on MyChart before the doctors have had a chance to review them. We understand that in some cases there may be results that are confusing or concerning to you. Please understand that not all results are received at the same time and often the doctors may need to interpret multiple results in order to provide you with the best plan of care or course of treatment. Therefore, we ask that you please give us 2 business days to thoroughly review all your results before contacting the office for clarification. Should we see a critical lab result, you will be contacted sooner.   If You Need Anything After Your Visit  If you have any questions or concerns for your doctor, please call our main line at [phone] and press option 4 to reach your doctor's medical assistant. If no one answers, please leave a voicemail as directed and we will return your call as soon as possible. Messages left after 4 pm will be answered the following business day.   You may also send us a message via MyChart. We typically respond to MyChart messages within 1-2 business days.  For prescription refills, please ask your pharmacy to contact our office. Our fax number is [phone].  If you have an urgent issue when the clinic is closed that cannot wait until the next business day, you can page your doctor at the number below.    Please note that while we do our best to be available for urgent issues outside of office hours, we are not available 24/7.   If you have an urgent issue and are unable to reach us, you may choose to seek medical care at your doctor's office, retail clinic, urgent care center, or emergency room.  If you have a medical emergency, please immediately call 911 or go to the emergency department.  Pager Numbers  - Dr. Kowalski: [phone]  - Dr. Moye: [phone]  - Dr. Stewart:  [phone]  In the event of inclement weather, please call our main line at [phone] for an update on the status of any delays or closures.  Dermatology Medication Tips: Please keep the boxes that topical medications come in in order to help keep track of the instructions about where and how to use these. Pharmacies typically print the medication instructions only on the boxes and not directly on the medication tubes.   If your medication is too expensive, please contact our office at [phone] option 4 or send us a message through MyChart.   We are unable to tell what your co-pay for medications will be in advance as this is different depending on your insurance coverage. However, we may be able to find a substitute medication at lower cost or fill out paperwork to get insurance to cover a needed medication.   If a prior authorization is required to get your medication covered by your insurance company, please allow us 1-2 business days to complete this process.  Drug prices often vary depending on where the prescription is filled and some pharmacies may offer cheaper prices.  The website www.goodrx.com contains coupons for medications through different pharmacies. The prices here do not account for what the cost may be with help from insurance (it may be cheaper with your insurance), but the website can give you the price if you did not use any insurance.  - You can print the associated coupon and take it with   your prescription to the pharmacy.  - You may also stop by our office during regular business hours and pick up a GoodRx coupon card.  - If you need your prescription sent electronically to a different pharmacy, notify our office through [facility] MyChart or by phone at [phone] option 4.     Si Usted Necesita Algo Despus de Su Visita  Tambin puede enviarnos un mensaje a travs de MyChart. Por lo general respondemos a los mensajes de MyChart en el transcurso de 1 a 2  das hbiles.  Para renovar recetas, por favor pida a su farmacia que se ponga en contacto con nuestra oficina. Nuestro nmero de fax es el [phone].  Si tiene un asunto urgente cuando la clnica est cerrada y que no puede esperar hasta el siguiente da hbil, puede llamar/localizar a su doctor(a) al nmero que aparece a continuacin.   Por favor, tenga en cuenta que aunque hacemos todo lo posible para estar disponibles para asuntos urgentes fuera del horario de oficina, no estamos disponibles las 24 horas del da, los 7 das de la semana.   Si tiene un problema urgente y no puede comunicarse con nosotros, puede optar por buscar atencin mdica  en el consultorio de su doctor(a), en una clnica privada, en un centro de atencin urgente o en una sala de emergencias.  Si tiene una emergencia mdica, por favor llame inmediatamente al 911 o vaya a la sala de emergencias.  Nmeros de bper  - Dr. Kowalski: [phone]  - Dra. Moye: [phone]  - Dra. Stewart: [phone]  En caso de inclemencias del tiempo, por favor llame a nuestra lnea principal al [phone] para una actualizacin sobre el estado de cualquier retraso o cierre.  Consejos para la medicacin en dermatologa: Por favor, guarde las cajas en las que vienen los medicamentos de uso tpico para ayudarle a seguir las instrucciones sobre dnde y cmo usarlos. Las farmacias generalmente imprimen las instrucciones del medicamento slo en las cajas y no directamente en los tubos del medicamento.   Si su medicamento es muy caro, por favor, pngase en contacto con nuestra oficina llamando al [phone] y presione la opcin 4 o envenos un mensaje a travs de MyChart.   No podemos decirle cul ser su copago por los medicamentos por adelantado ya que esto es diferente dependiendo de la cobertura de su seguro. Sin embargo, es posible que podamos encontrar un medicamento sustituto a menor costo o llenar un formulario para que el  seguro cubra el medicamento que se considera necesario.   Si se requiere una autorizacin previa para que su compaa de seguros cubra su medicamento, por favor permtanos de 1 a 2 das hbiles para completar este proceso.  Los precios de los medicamentos varan con frecuencia dependiendo del lugar de dnde se surte la receta y alguna farmacias pueden ofrecer precios ms baratos.  El sitio web www.goodrx.com tiene cupones para medicamentos de diferentes farmacias. Los precios aqu no tienen en cuenta lo que podra costar con la ayuda del seguro (puede ser ms barato con su seguro), pero el sitio web puede darle el precio si no utiliz ningn seguro.  - Puede imprimir el cupn correspondiente y llevarlo con su receta a la farmacia.  - Tambin puede pasar por nuestra oficina durante el horario de atencin regular y recoger una tarjeta de cupones de GoodRx.  - Si necesita que su receta se enve electrnicamente a una farmacia diferente, informe a nuestra oficina a travs de MyChart de [facility]   o por telfono llamando al [phone] y presione la opcin 4.  

## 2022-08-19 NOTE — Progress Notes (Signed)
   New Patient Visit  Subjective  Angela Spencer is a 31 y.o. female who presents for the following: Rash (Patient c/o itchy rash around her eyes for ~ 1 year, she has tried several products with a poor response, patient also has a rash on her body and using Triamcinolone cream with a poor response on her body. ).  The following portions of the chart were reviewed this encounter and updated as appropriate:   Tobacco  Allergies  Meds  Problems  Med Hx  Surg Hx  Fam Hx     Review of Systems:  No other skin or systemic complaints except as noted in HPI or Assessment and Plan.  Objective  Well appearing patient in no apparent distress; mood and affect are within normal limits.  A focused examination was performed including face, arms, trunk. Relevant physical exam findings are noted in the Assessment and Plan.  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 It is often associated with allergic rhinitis and/or asthma and can require treatment with topical medications, phototherapy, or in severe cases biologic injectable medication (Dupixent; Adbry) or Oral JAK inhibitors.   Patient is allergic to nickel  May consider patch testing in the future when skin is improving from Dupixent injection  Recommend changing eye glasses to a plastic frame eye glasses   Start Protopic ointment apply to skin qd- bid   Dupilumab (Dupixent) is a treatment given by injection for adults and children with moderate-to-severe atopic dermatitis. Goal is control of skin condition, not cure. It is given as 2 injections at the first dose followed by 1 injection ever 2 weeks thereafter.  Young children are dosed  monthly.  Potential side effects include allergic reaction, herpes infections, injection site reactions and conjunctivitis (inflammation of the eyes).  The use of Dupixent requires long term medication management, including periodic office visits.   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  dupilumab (DUPIXENT) 300 MG/2ML prefilled syringe - face, exts, trunk Inject 600 mg into the skin once for 1 dose. On day 1.  Return in about 2 weeks (around 09/02/2022) for Atopic dermatitis, atopic dermatitis .  IAngelique Holm, CMA, am acting as scribe for Armida Sans, MD .  Documentation: I have reviewed the above documentation for accuracy and completeness, and I agree with the above.  Armida Sans, MD

## 2022-08-28 ENCOUNTER — Encounter: Payer: Self-pay | Admitting: Dermatology

## 2022-08-31 ENCOUNTER — Ambulatory Visit: Payer: Medicaid Other | Admitting: Dermatology

## 2022-08-31 DIAGNOSIS — L230 Allergic contact dermatitis due to metals: Secondary | ICD-10-CM | POA: Diagnosis not present

## 2022-08-31 DIAGNOSIS — L299 Pruritus, unspecified: Secondary | ICD-10-CM

## 2022-08-31 DIAGNOSIS — L2081 Atopic neurodermatitis: Secondary | ICD-10-CM | POA: Diagnosis not present

## 2022-08-31 DIAGNOSIS — L819 Disorder of pigmentation, unspecified: Secondary | ICD-10-CM | POA: Diagnosis not present

## 2022-08-31 DIAGNOSIS — Z79899 Other long term (current) drug therapy: Secondary | ICD-10-CM

## 2022-08-31 MED ORDER — DUPIXENT 300 MG/2ML ~~LOC~~ SOAJ: 300.0000 mg | SUBCUTANEOUS | 3 refills | Status: AC

## 2022-08-31 MED ORDER — DUPIXENT 300 MG/2ML ~~LOC~~ SOSY: 300.0000 mg | PREFILLED_SYRINGE | SUBCUTANEOUS | 0 refills | Status: AC

## 2022-08-31 NOTE — Progress Notes (Signed)
   Follow-Up Visit   Subjective  Angela Spencer is a 31 y.o. female who presents for the following: Follow-up (Atopic neurodermatitis follow up - Dupixent injection today - has noticed a little bit less itching). Pt initiated Dupixent 08/19/2022.  Today is follow-up and second injection visit.  The following portions of the chart were reviewed this encounter and updated as appropriate:   Tobacco  Allergies  Meds  Problems  Med Hx  Surg Hx  Fam Hx      Review of Systems:  No other skin or systemic complaints except as noted in HPI or Assessment and Plan.  Objective  Well appearing patient in no apparent distress; mood and affect are within normal limits.  A focused examination was performed including face, arms, back. Relevant physical exam findings are noted in the Assessment and Plan.   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 It is often associated with allergic rhinitis and/or asthma and can require treatment with topical medications, phototherapy, or in severe cases biologic injectable medication (Dupixent; Adbry) or Oral JAK inhibitors.    Patient is allergic to nickel  May consider patch testing in the future when skin is improving from Dupixent injection  She has an upcoming appointment with her ophthalmologist.  Continue  Protopic ointment apply to skin qd- bid    Dupilumab (Dupixent) is a treatment given by injection for adults and children with moderate-to-severe atopic dermatitis. Goal is control of skin condition, not cure. It is given as 2 injections at the first dose followed by 1 injection ever 2 weeks thereafter.  Young children are dosed monthly.   Potential side effects include allergic reaction, herpes infections, injection site  reactions and conjunctivitis (inflammation of the eyes).  The use of Dupixent requires long term medication management, including periodic office visits.    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 Starting at day 15 for maintenance.  Dupilumab (DUPIXENT) 300 MG/2ML SOPN Inject 300 mg into the skin every 14 (fourteen) days. Starting at day 15 for maintenance.  Related Medications tacrolimus (PROTOPIC) 0.1 % ointment Apply topically 2 (two) times daily.  Return in about 2 weeks (around 09/14/2022) for Dupixent.  I, Joanie Coddington, CMA, am acting as scribe for Armida Sans, MD . Documentation: I have reviewed the above documentation for accuracy and completeness, and I agree with the above.  Armida Sans, MD

## 2022-08-31 NOTE — Patient Instructions (Signed)
Due to recent changes in healthcare laws, you may see results of your pathology and/or laboratory studies on MyChart before the doctors have had a chance to review them. We understand that in some cases there may be results that are confusing or concerning to you. Please understand that not all results are received at the same time and often the doctors may need to interpret multiple results in order to provide you with the best plan of care or course of treatment. Therefore, we ask that you please give us 2 business days to thoroughly review all your results before contacting the office for clarification. Should we see a critical lab result, you will be contacted sooner.   If You Need Anything After Your Visit  If you have any questions or concerns for your doctor, please call our main line at [phone] and press option 4 to reach your doctor's medical assistant. If no one answers, please leave a voicemail as directed and we will return your call as soon as possible. Messages left after 4 pm will be answered the following business day.   You may also send us a message via MyChart. We typically respond to MyChart messages within 1-2 business days.  For prescription refills, please ask your pharmacy to contact our office. Our fax number is [phone].  If you have an urgent issue when the clinic is closed that cannot wait until the next business day, you can page your doctor at the number below.    Please note that while we do our best to be available for urgent issues outside of office hours, we are not available 24/7.   If you have an urgent issue and are unable to reach us, you may choose to seek medical care at your doctor's office, retail clinic, urgent care center, or emergency room.  If you have a medical emergency, please immediately call 911 or go to the emergency department.  Pager Numbers  - Dr. Kowalski: [phone]  - Dr. Moye: [phone]  - Dr. Stewart:  [phone]  In the event of inclement weather, please call our main line at [phone] for an update on the status of any delays or closures.  Dermatology Medication Tips: Please keep the boxes that topical medications come in in order to help keep track of the instructions about where and how to use these. Pharmacies typically print the medication instructions only on the boxes and not directly on the medication tubes.   If your medication is too expensive, please contact our office at [phone] option 4 or send us a message through MyChart.   We are unable to tell what your co-pay for medications will be in advance as this is different depending on your insurance coverage. However, we may be able to find a substitute medication at lower cost or fill out paperwork to get insurance to cover a needed medication.   If a prior authorization is required to get your medication covered by your insurance company, please allow us 1-2 business days to complete this process.  Drug prices often vary depending on where the prescription is filled and some pharmacies may offer cheaper prices.  The website www.goodrx.com contains coupons for medications through different pharmacies. The prices here do not account for what the cost may be with help from insurance (it may be cheaper with your insurance), but the website can give you the price if you did not use any insurance.  - You can print the associated coupon and take it with   your prescription to the pharmacy.  - You may also stop by our office during regular business hours and pick up a GoodRx coupon card.  - If you need your prescription sent electronically to a different pharmacy, notify our office through [facility] MyChart or by phone at [phone] option 4.     Si Usted Necesita Algo Despus de Su Visita  Tambin puede enviarnos un mensaje a travs de MyChart. Por lo general respondemos a los mensajes de MyChart en el transcurso de 1 a 2  das hbiles.  Para renovar recetas, por favor pida a su farmacia que se ponga en contacto con nuestra oficina. Nuestro nmero de fax es el [phone].  Si tiene un asunto urgente cuando la clnica est cerrada y que no puede esperar hasta el siguiente da hbil, puede llamar/localizar a su doctor(a) al nmero que aparece a continuacin.   Por favor, tenga en cuenta que aunque hacemos todo lo posible para estar disponibles para asuntos urgentes fuera del horario de oficina, no estamos disponibles las 24 horas del da, los 7 das de la semana.   Si tiene un problema urgente y no puede comunicarse con nosotros, puede optar por buscar atencin mdica  en el consultorio de su doctor(a), en una clnica privada, en un centro de atencin urgente o en una sala de emergencias.  Si tiene una emergencia mdica, por favor llame inmediatamente al 911 o vaya a la sala de emergencias.  Nmeros de bper  - Dr. Kowalski: [phone]  - Dra. Moye: [phone]  - Dra. Stewart: [phone]  En caso de inclemencias del tiempo, por favor llame a nuestra lnea principal al [phone] para una actualizacin sobre el estado de cualquier retraso o cierre.  Consejos para la medicacin en dermatologa: Por favor, guarde las cajas en las que vienen los medicamentos de uso tpico para ayudarle a seguir las instrucciones sobre dnde y cmo usarlos. Las farmacias generalmente imprimen las instrucciones del medicamento slo en las cajas y no directamente en los tubos del medicamento.   Si su medicamento es muy caro, por favor, pngase en contacto con nuestra oficina llamando al [phone] y presione la opcin 4 o envenos un mensaje a travs de MyChart.   No podemos decirle cul ser su copago por los medicamentos por adelantado ya que esto es diferente dependiendo de la cobertura de su seguro. Sin embargo, es posible que podamos encontrar un medicamento sustituto a menor costo o llenar un formulario para que el  seguro cubra el medicamento que se considera necesario.   Si se requiere una autorizacin previa para que su compaa de seguros cubra su medicamento, por favor permtanos de 1 a 2 das hbiles para completar este proceso.  Los precios de los medicamentos varan con frecuencia dependiendo del lugar de dnde se surte la receta y alguna farmacias pueden ofrecer precios ms baratos.  El sitio web www.goodrx.com tiene cupones para medicamentos de diferentes farmacias. Los precios aqu no tienen en cuenta lo que podra costar con la ayuda del seguro (puede ser ms barato con su seguro), pero el sitio web puede darle el precio si no utiliz ningn seguro.  - Puede imprimir el cupn correspondiente y llevarlo con su receta a la farmacia.  - Tambin puede pasar por nuestra oficina durante el horario de atencin regular y recoger una tarjeta de cupones de GoodRx.  - Si necesita que su receta se enve electrnicamente a una farmacia diferente, informe a nuestra oficina a travs de MyChart de [facility]   o por telfono llamando al [phone] y presione la opcin 4.  

## 2022-09-13 ENCOUNTER — Ambulatory Visit: Payer: Medicaid Other | Admitting: Dermatology

## 2022-09-15 ENCOUNTER — Telehealth: Payer: Self-pay

## 2022-09-15 NOTE — Telephone Encounter (Signed)
Encounter notes from the [facility] scanned in under the media tab for your review.

## 2022-09-16 NOTE — Telephone Encounter (Signed)
Patient called and asked to call us back to go over Dr. Philemon Kingdom recommendations.

## 2022-09-17 ENCOUNTER — Encounter: Payer: Self-pay | Admitting: Dermatology

## 2022-09-21 ENCOUNTER — Encounter: Payer: Self-pay | Admitting: Dermatology

## 2022-09-21 ENCOUNTER — Ambulatory Visit (INDEPENDENT_AMBULATORY_CARE_PROVIDER_SITE_OTHER): Payer: Medicaid Other | Admitting: Dermatology

## 2022-09-21 DIAGNOSIS — L2081 Atopic neurodermatitis: Secondary | ICD-10-CM | POA: Diagnosis not present

## 2022-09-21 MED ORDER — DUPIXENT 300 MG/2ML ~~LOC~~ SOAJ: 300.0000 mg | SUBCUTANEOUS | 0 refills | Status: AC

## 2022-09-21 NOTE — Progress Notes (Signed)
Follow-Up Visit   Subjective  Angela Spencer is a 31 y.o. female who presents for the following: Eczema (Arms, back, legs, itchy TMC 0.1% oint bid, Tacrolimus qd, HC 2.5%, Dupixent sq injections, Pt seen by Ophthalomogy 09/14/2022 for Conjunctivitis - present for months (see Ophthalmology note in Media).  Pt had conjunctivitis symptoms before starting Dupixent in this office 08/19/2022,  Pt missed Dupixent dose last week and feels like the skin around her eyes flared). She feels like eyes are better (not worse) since starting Dupixent.  The following portions of the chart were reviewed this encounter and updated as appropriate:   Tobacco  Allergies  Meds  Problems  Med Hx  Surg Hx  Fam Hx     Review of Systems:  No other skin or systemic complaints except as noted in HPI or Assessment and Plan.  Objective  Well appearing patient in no apparent distress; mood and affect are within normal limits.  A focused examination was performed including arms. Relevant physical exam findings are noted in the Assessment and Plan.  trunk, extremities, face Hyperpigmented patches with erythema and excoriations               Assessment & Plan  Atopic neurodermatitis trunk, extremities, face  Severe with dyschromia and edema and excoriations Patient notices less itching after initiating Dupixent 08/19/2022   - Pt seen 09/14/2022 for Conjunctivitis - present for months.  Pt had conjunctivitis symptoms before starting Dupixent in this office 08/19/2022  -Photos from last visit severe, photos today show improvement, patient does not think the Dupixent is exacerbating  her eyes. Patient does want to continue with Dupixent injections.  Will continue Dupixent, can consider Adbry in the future if worsening of the eyes.  Atopic dermatitis (eczema) is a chronic, relapsing, pruritic condition that can significantly affect quality of life. It is often associated with allergic rhinitis and/or  asthma and can require treatment with topical medications, phototherapy, or in severe cases biologic injectable medication (Dupixent; Adbry) or Oral JAK inhibitors.    Patient is allergic to nickel  May consider patch testing in the future when skin is improving from Dupixent injection   Cont Dupixent 300mg /7ml sq injections q 2 wks, Dupixent to R upper arm today, Lot # 3m exp 08/10/2024 Cont TMC 0.1% oint qd up to 5d/wk aa eczema, avoid f/g/a Cont Tacrolimus 0.1% oint qd/bid aa eczema face/body Cont HC 2.5% cr qd up to 5d/wk aa eczema face  Topical steroids (such as triamcinolone, fluocinolone, fluocinonide, mometasone, clobetasol, halobetasol, betamethasone, hydrocortisone) can cause thinning and lightening of the skin if they are used for too long in the same area. Your physician has selected the right strength medicine for your problem and area affected on the body. Please use your medication only as directed by your physician to prevent side effects.    Dupilumab (Dupixent) is a treatment given by injection for adults and children with moderate-to-severe atopic dermatitis. Goal is control of skin condition, not cure. It is given as 2 injections at the first dose followed by 1 injection ever 2 weeks thereafter.  Young children are dosed monthly.  Potential side effects include allergic reaction, herpes infections, injection site reactions and conjunctivitis (inflammation of the eyes).  The use of Dupixent requires long term medication management, including periodic office visits.   Dupilumab (DUPIXENT) 300 MG/2ML SOPN - trunk, extremities, face Inject 300 mg into the skin every 14 (fourteen) days. Starting at day 15 for maintenance.  Related Medications tacrolimus (  PROTOPIC) 0.1 % ointment Apply topically 2 (two) times daily.  dupilumab (DUPIXENT) 300 MG/2ML prefilled syringe Inject 300 mg into the skin every 14 (fourteen) days. Starting at day 15 for maintenance.  Dupilumab  (DUPIXENT) 300 MG/2ML SOPN Inject 300 mg into the skin every 14 (fourteen) days. Starting at day 15 for maintenance.   Return in about 2 weeks (around 10/05/2022) for Atopic Derm with nurse.  I, Ardis Rowan, RMA, am acting as scribe for Armida Sans, MD . Documentation: I have reviewed the above documentation for accuracy and completeness, and I agree with the above.  Armida Sans, MD

## 2022-09-21 NOTE — Patient Instructions (Signed)
Due to recent changes in healthcare laws, you may see results of your pathology and/or laboratory studies on MyChart before the doctors have had a chance to review them. We understand that in some cases there may be results that are confusing or concerning to you. Please understand that not all results are received at the same time and often the doctors may need to interpret multiple results in order to provide you with the best plan of care or course of treatment. Therefore, we ask that you please give us 2 business days to thoroughly review all your results before contacting the office for clarification. Should we see a critical lab result, you will be contacted sooner.   If You Need Anything After Your Visit  If you have any questions or concerns for your doctor, please call our main line at [phone] and press option 4 to reach your doctor's medical assistant. If no one answers, please leave a voicemail as directed and we will return your call as soon as possible. Messages left after 4 pm will be answered the following business day.   You may also send us a message via MyChart. We typically respond to MyChart messages within 1-2 business days.  For prescription refills, please ask your pharmacy to contact our office. Our fax number is [phone].  If you have an urgent issue when the clinic is closed that cannot wait until the next business day, you can page your doctor at the number below.    Please note that while we do our best to be available for urgent issues outside of office hours, we are not available 24/7.   If you have an urgent issue and are unable to reach us, you may choose to seek medical care at your doctor's office, retail clinic, urgent care center, or emergency room.  If you have a medical emergency, please immediately call 911 or go to the emergency department.  Pager Numbers  - Dr. Kowalski: [phone]  - Dr. Moye: [phone]  - Dr. Stewart:  [phone]  In the event of inclement weather, please call our main line at [phone] for an update on the status of any delays or closures.  Dermatology Medication Tips: Please keep the boxes that topical medications come in in order to help keep track of the instructions about where and how to use these. Pharmacies typically print the medication instructions only on the boxes and not directly on the medication tubes.   If your medication is too expensive, please contact our office at [phone] option 4 or send us a message through MyChart.   We are unable to tell what your co-pay for medications will be in advance as this is different depending on your insurance coverage. However, we may be able to find a substitute medication at lower cost or fill out paperwork to get insurance to cover a needed medication.   If a prior authorization is required to get your medication covered by your insurance company, please allow us 1-2 business days to complete this process.  Drug prices often vary depending on where the prescription is filled and some pharmacies may offer cheaper prices.  The website www.goodrx.com contains coupons for medications through different pharmacies. The prices here do not account for what the cost may be with help from insurance (it may be cheaper with your insurance), but the website can give you the price if you did not use any insurance.  - You can print the associated coupon and take it with   your prescription to the pharmacy.  - You may also stop by our office during regular business hours and pick up a GoodRx coupon card.  - If you need your prescription sent electronically to a different pharmacy, notify our office through [facility] MyChart or by phone at [phone] option 4.     Si Usted Necesita Algo Despus de Su Visita  Tambin puede enviarnos un mensaje a travs de MyChart. Por lo general respondemos a los mensajes de MyChart en el transcurso de 1 a 2  das hbiles.  Para renovar recetas, por favor pida a su farmacia que se ponga en contacto con nuestra oficina. Nuestro nmero de fax es el [phone].  Si tiene un asunto urgente cuando la clnica est cerrada y que no puede esperar hasta el siguiente da hbil, puede llamar/localizar a su doctor(a) al nmero que aparece a continuacin.   Por favor, tenga en cuenta que aunque hacemos todo lo posible para estar disponibles para asuntos urgentes fuera del horario de oficina, no estamos disponibles las 24 horas del da, los 7 das de la semana.   Si tiene un problema urgente y no puede comunicarse con nosotros, puede optar por buscar atencin mdica  en el consultorio de su doctor(a), en una clnica privada, en un centro de atencin urgente o en una sala de emergencias.  Si tiene una emergencia mdica, por favor llame inmediatamente al 911 o vaya a la sala de emergencias.  Nmeros de bper  - Dr. Kowalski: [phone]  - Dra. Moye: [phone]  - Dra. Stewart: [phone]  En caso de inclemencias del tiempo, por favor llame a nuestra lnea principal al [phone] para una actualizacin sobre el estado de cualquier retraso o cierre.  Consejos para la medicacin en dermatologa: Por favor, guarde las cajas en las que vienen los medicamentos de uso tpico para ayudarle a seguir las instrucciones sobre dnde y cmo usarlos. Las farmacias generalmente imprimen las instrucciones del medicamento slo en las cajas y no directamente en los tubos del medicamento.   Si su medicamento es muy caro, por favor, pngase en contacto con nuestra oficina llamando al [phone] y presione la opcin 4 o envenos un mensaje a travs de MyChart.   No podemos decirle cul ser su copago por los medicamentos por adelantado ya que esto es diferente dependiendo de la cobertura de su seguro. Sin embargo, es posible que podamos encontrar un medicamento sustituto a menor costo o llenar un formulario para que el  seguro cubra el medicamento que se considera necesario.   Si se requiere una autorizacin previa para que su compaa de seguros cubra su medicamento, por favor permtanos de 1 a 2 das hbiles para completar este proceso.  Los precios de los medicamentos varan con frecuencia dependiendo del lugar de dnde se surte la receta y alguna farmacias pueden ofrecer precios ms baratos.  El sitio web www.goodrx.com tiene cupones para medicamentos de diferentes farmacias. Los precios aqu no tienen en cuenta lo que podra costar con la ayuda del seguro (puede ser ms barato con su seguro), pero el sitio web puede darle el precio si no utiliz ningn seguro.  - Puede imprimir el cupn correspondiente y llevarlo con su receta a la farmacia.  - Tambin puede pasar por nuestra oficina durante el horario de atencin regular y recoger una tarjeta de cupones de GoodRx.  - Si necesita que su receta se enve electrnicamente a una farmacia diferente, informe a nuestra oficina a travs de MyChart de [place]   o por telfono llamando al [phone] y presione la opcin 4.  

## 2022-10-06 ENCOUNTER — Ambulatory Visit: Payer: Medicaid Other

## 2022-10-06 MED ORDER — DUPIXENT 300 MG/2ML ~~LOC~~ SOSY: 300.0000 mg | PREFILLED_SYRINGE | SUBCUTANEOUS | 0 refills | Status: AC

## 2022-10-06 NOTE — Progress Notes (Signed)
Patient here today for two week Dupixent injection for severe Atopic Dermatitis.   Dupixent 300mg  injected into left upper arm. Patient tolerated well.   LOT: EXP: 12/2024 NDC: 01/2025  [id number], RMA

## 2022-10-20 ENCOUNTER — Ambulatory Visit: Payer: Medicaid Other | Admitting: Dermatology

## 2022-10-21 ENCOUNTER — Ambulatory Visit (INDEPENDENT_AMBULATORY_CARE_PROVIDER_SITE_OTHER): Payer: Medicaid Other | Admitting: Dermatology

## 2022-10-21 DIAGNOSIS — L230 Allergic contact dermatitis due to metals: Secondary | ICD-10-CM | POA: Diagnosis not present

## 2022-10-21 DIAGNOSIS — L2089 Other atopic dermatitis: Secondary | ICD-10-CM

## 2022-10-21 DIAGNOSIS — Z79899 Other long term (current) drug therapy: Secondary | ICD-10-CM

## 2022-10-21 MED ORDER — DUPIXENT 300 MG/2ML ~~LOC~~ SOSY: 300.0000 mg | PREFILLED_SYRINGE | SUBCUTANEOUS | 0 refills | Status: AC

## 2022-10-21 NOTE — Patient Instructions (Signed)
Due to recent changes in healthcare laws, you may see results of your pathology and/or laboratory studies on MyChart before the doctors have had a chance to review them. We understand that in some cases there may be results that are confusing or concerning to you. Please understand that not all results are received at the same time and often the doctors may need to interpret multiple results in order to provide you with the best plan of care or course of treatment. Therefore, we ask that you please give us 2 business days to thoroughly review all your results before contacting the office for clarification. Should we see a critical lab result, you will be contacted sooner.   If You Need Anything After Your Visit  If you have any questions or concerns for your doctor, please call our main line at [phone] and press option 4 to reach your doctor's medical assistant. If no one answers, please leave a voicemail as directed and we will return your call as soon as possible. Messages left after 4 pm will be answered the following business day.   You may also send us a message via MyChart. We typically respond to MyChart messages within 1-2 business days.  For prescription refills, please ask your pharmacy to contact our office. Our fax number is [phone].  If you have an urgent issue when the clinic is closed that cannot wait until the next business day, you can page your doctor at the number below.    Please note that while we do our best to be available for urgent issues outside of office hours, we are not available 24/7.   If you have an urgent issue and are unable to reach us, you may choose to seek medical care at your doctor's office, retail clinic, urgent care center, or emergency room.  If you have a medical emergency, please immediately call 911 or go to the emergency department.  Pager Numbers  - Dr. Kowalski: [phone]  - Dr. Moye: [phone]  - Dr. Stewart:  [phone]  In the event of inclement weather, please call our main line at [phone] for an update on the status of any delays or closures.  Dermatology Medication Tips: Please keep the boxes that topical medications come in in order to help keep track of the instructions about where and how to use these. Pharmacies typically print the medication instructions only on the boxes and not directly on the medication tubes.   If your medication is too expensive, please contact our office at [phone] option 4 or send us a message through MyChart.   We are unable to tell what your co-pay for medications will be in advance as this is different depending on your insurance coverage. However, we may be able to find a substitute medication at lower cost or fill out paperwork to get insurance to cover a needed medication.   If a prior authorization is required to get your medication covered by your insurance company, please allow us 1-2 business days to complete this process.  Drug prices often vary depending on where the prescription is filled and some pharmacies may offer cheaper prices.  The website www.goodrx.com contains coupons for medications through different pharmacies. The prices here do not account for what the cost may be with help from insurance (it may be cheaper with your insurance), but the website can give you the price if you did not use any insurance.  - You can print the associated coupon and take it with   your prescription to the pharmacy.  - You may also stop by our office during regular business hours and pick up a GoodRx coupon card.  - If you need your prescription sent electronically to a different pharmacy, notify our office through [facility] MyChart or by phone at [phone] option 4.     Si Usted Necesita Algo Despus de Su Visita  Tambin puede enviarnos un mensaje a travs de MyChart. Por lo general respondemos a los mensajes de MyChart en el transcurso de 1 a 2  das hbiles.  Para renovar recetas, por favor pida a su farmacia que se ponga en contacto con nuestra oficina. Nuestro nmero de fax es el [phone].  Si tiene un asunto urgente cuando la clnica est cerrada y que no puede esperar hasta el siguiente da hbil, puede llamar/localizar a su doctor(a) al nmero que aparece a continuacin.   Por favor, tenga en cuenta que aunque hacemos todo lo posible para estar disponibles para asuntos urgentes fuera del horario de oficina, no estamos disponibles las 24 horas del da, los 7 das de la semana.   Si tiene un problema urgente y no puede comunicarse con nosotros, puede optar por buscar atencin mdica  en el consultorio de su doctor(a), en una clnica privada, en un centro de atencin urgente o en una sala de emergencias.  Si tiene una emergencia mdica, por favor llame inmediatamente al 911 o vaya a la sala de emergencias.  Nmeros de bper  - Dr. Kowalski: [phone]  - Dra. Moye: [phone]  - Dra. Stewart: [phone]  En caso de inclemencias del tiempo, por favor llame a nuestra lnea principal al [phone] para una actualizacin sobre el estado de cualquier retraso o cierre.  Consejos para la medicacin en dermatologa: Por favor, guarde las cajas en las que vienen los medicamentos de uso tpico para ayudarle a seguir las instrucciones sobre dnde y cmo usarlos. Las farmacias generalmente imprimen las instrucciones del medicamento slo en las cajas y no directamente en los tubos del medicamento.   Si su medicamento es muy caro, por favor, pngase en contacto con nuestra oficina llamando al [phone] y presione la opcin 4 o envenos un mensaje a travs de MyChart.   No podemos decirle cul ser su copago por los medicamentos por adelantado ya que esto es diferente dependiendo de la cobertura de su seguro. Sin embargo, es posible que podamos encontrar un medicamento sustituto a menor costo o llenar un formulario para que el  seguro cubra el medicamento que se considera necesario.   Si se requiere una autorizacin previa para que su compaa de seguros cubra su medicamento, por favor permtanos de 1 a 2 das hbiles para completar este proceso.  Los precios de los medicamentos varan con frecuencia dependiendo del lugar de dnde se surte la receta y alguna farmacias pueden ofrecer precios ms baratos.  El sitio web www.goodrx.com tiene cupones para medicamentos de diferentes farmacias. Los precios aqu no tienen en cuenta lo que podra costar con la ayuda del seguro (puede ser ms barato con su seguro), pero el sitio web puede darle el precio si no utiliz ningn seguro.  - Puede imprimir el cupn correspondiente y llevarlo con su receta a la farmacia.  - Tambin puede pasar por nuestra oficina durante el horario de atencin regular y recoger una tarjeta de cupones de GoodRx.  - Si necesita que su receta se enve electrnicamente a una farmacia diferente, informe a nuestra oficina a travs de MyChart de [place]   o por telfono llamando al [phone] y presione la opcin 4.  

## 2022-10-25 ENCOUNTER — Encounter: Payer: Self-pay | Admitting: Dermatology

## 2022-10-25 NOTE — Progress Notes (Signed)
   Follow-Up Visit   Subjective  Angela Spencer is a 32 y.o. female who presents for the following: Follow-up (2 week follow up of Atopic Dermatitis - starts flaring about 3 days before her next injection). Overall improved, though.  The following portions of the chart were reviewed this encounter and updated as appropriate:   Tobacco  Allergies  Meds  Problems  Med Hx  Surg Hx  Fam Hx     Review of Systems:  No other skin or systemic complaints except as noted in HPI or Assessment and Plan.  Objective  Well appearing patient in no apparent distress; mood and affect are within normal limits.  A focused examination was performed including face, arms. Relevant physical exam findings are noted in the Assessment and Plan.   Assessment & Plan  Other atopic dermatitis  Severe with dyschromia and edema and excoriations Patient notices less itching after initiating Dupixent 08/19/2022 Chronic and persistent condition with duration or expected duration over one year. Condition is symptomatic / bothersome to patient. Not to goal, but improving. Will plan to continue [place] for 1 month. If she is not improving after a month, will plan to change to Adbry. If not improving with Adbry, will plan Cinbinqo or Rinvoq  Atopic dermatitis (eczema) is a chronic, relapsing, pruritic condition that can significantly affect quality of life. It is often associated with allergic rhinitis and/or asthma and can require treatment with topical medications, phototherapy, or in severe cases biologic injectable medication (Dupixent; Adbry) or Oral JAK inhibitors.    Patient is allergic to nickel  May consider patch testing in the future when skin is improving from Dupixent injection    Cont Dupixent 300mg /74ml sq injections q 2 wks, Dupixent to Left upper arm today, Lot # [id number] exp 12/2024 Cont TMC 0.1% oint qd up to 5d/wk aa eczema, avoid f/g/a Cont Tacrolimus 0.1% oint qd/bid aa eczema face/body Cont HC  2.5% cr qd up to 5d/wk aa eczema face   Topical steroids (such as triamcinolone, fluocinolone, fluocinonide, mometasone, clobetasol, halobetasol, betamethasone, hydrocortisone) can cause thinning and lightening of the skin if they are used for too long in the same area. Your physician has selected the right strength medicine for your problem and area affected on the body. Please use your medication only as directed by your physician to prevent side effects.     Dupilumab (Dupixent) is a treatment given by injection for adults and children with moderate-to-severe atopic dermatitis. Goal is control of skin condition, not cure. It is given as 2 injections at the first dose followed by 1 injection ever 2 weeks thereafter.  Young children are dosed monthly.   Potential side effects include allergic reaction, herpes infections, injection site reactions and conjunctivitis (inflammation of the eyes).  The use of Dupixent requires long term medication management, including periodic office visits.   dupilumab (DUPIXENT) 300 MG/2ML prefilled syringe Inject 300 mg into the skin every 14 (fourteen) days. Starting at day 15 for maintenance.  Return in about 2 weeks (around 11/04/2022) for [place].  I, Ashok Cordia, CMA, am acting as scribe for Sarina Ser, MD . Documentation: I have reviewed the above documentation for accuracy and completeness, and I agree with the above.  Sarina Ser, MD

## 2022-11-02 ENCOUNTER — Other Ambulatory Visit: Payer: Self-pay | Admitting: Internal Medicine

## 2022-11-04 ENCOUNTER — Ambulatory Visit (INDEPENDENT_AMBULATORY_CARE_PROVIDER_SITE_OTHER): Payer: Medicaid Other | Admitting: Dermatology

## 2022-11-04 VITALS — BP 121/82 | HR 69

## 2022-11-04 DIAGNOSIS — Z79899 Other long term (current) drug therapy: Secondary | ICD-10-CM | POA: Diagnosis not present

## 2022-11-04 DIAGNOSIS — L2081 Atopic neurodermatitis: Secondary | ICD-10-CM

## 2022-11-04 DIAGNOSIS — L819 Disorder of pigmentation, unspecified: Secondary | ICD-10-CM | POA: Diagnosis not present

## 2022-11-04 DIAGNOSIS — Z9109 Other allergy status, other than to drugs and biological substances: Secondary | ICD-10-CM | POA: Diagnosis not present

## 2022-11-04 MED ORDER — DUPIXENT 300 MG/2ML ~~LOC~~ SOSY: 300.0000 mg | PREFILLED_SYRINGE | SUBCUTANEOUS | 0 refills | Status: AC

## 2022-11-04 NOTE — Progress Notes (Signed)
Follow-Up Visit   Subjective  Angela Spencer is a 32 y.o. female who presents for the following: Eczema (Face, trunk, extremities, 2wk f/u, Dupixent sq injections).  The following portions of the chart were reviewed this encounter and updated as appropriate:   Tobacco  Allergies  Meds  Problems  Med Hx  Surg Hx  Fam Hx     Review of Systems:  No other skin or systemic complaints except as noted in HPI or Assessment and Plan.  Objective  Well appearing patient in no apparent distress; mood and affect are within normal limits.  A focused examination was performed including face, arms. Relevant physical exam findings are noted in the Assessment and Plan.  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 Condition is symptomatic / bothersome to patient. Not to goal, but improving. Will plan to continue [place] for 1 month. If she is not improving after a month, will plan to change to Adbry. If not improving with Adbry, will plan Cinbinqo or Rinvoq   Atopic dermatitis (eczema) is a chronic, relapsing, pruritic condition that can significantly affect quality of life. It is often associated with allergic rhinitis and/or asthma and can require treatment with topical medications, phototherapy, or in severe cases biologic injectable medication (Dupixent; Adbry) or Oral JAK inhibitors.    Patient is allergic to nickel  May consider patch testing in the future when skin is improving from Dupixent injection    Cont Dupixent 300mg /35ml sq injections q 2 wks, Dupixent to R upper arm today, Lot # [id number] exp 12/2024 Cont TMC 0.1% oint qd up to 5d/wk aa eczema, avoid f/g/a Cont Tacrolimus 0.1% oint  qd/bid aa eczema face/body Cont HC 2.5% cr qd up to 5d/wk aa eczema face Start Claritin 1-3 po qd   Topical steroids (such as triamcinolone, fluocinolone, fluocinonide, mometasone, clobetasol, halobetasol, betamethasone, hydrocortisone) can cause thinning and lightening of the skin if they are used for too long in the same area. Your physician has selected the right strength medicine for your problem and area affected on the body. Please use your medication only as directed by your physician to prevent side effects.    Dupilumab (Dupixent) is a treatment given by injection for adults and children with moderate-to-severe atopic dermatitis. Goal is control of skin condition, not cure. It is given as 2 injections at the first dose followed by 1 injection ever 2 weeks thereafter.  Young children are dosed monthly.  Potential side effects include allergic reaction, herpes infections, injection site reactions and conjunctivitis (inflammation of the eyes).  The use of Dupixent requires long term medication management, including periodic office visits.   dupilumab (DUPIXENT) 300 MG/2ML prefilled syringe - Chest - Medial (Center) Inject 300 mg into the skin every 14 (fourteen) days. Starting at day 15 for maintenance.  Related Medications tacrolimus (PROTOPIC) 0.1 % ointment Apply topically 2 (two) times daily.  dupilumab (DUPIXENT) 300 MG/2ML prefilled syringe Inject 300 mg into the skin every 14 (fourteen) days. Starting at day 15 for maintenance.  Dupilumab (DUPIXENT) 300 MG/2ML SOPN Inject 300 mg into the skin every 14 (fourteen) days. Starting at day 15 for maintenance.  Dupilumab (DUPIXENT) 300 MG/2ML SOPN Inject 300 mg into the skin every 14 (fourteen) days. Starting at  day 15 for maintenance.   Return in about 2 weeks (around 11/18/2022).  I, Othelia Pulling, RMA, am acting as scribe for Sarina Ser, MD . Documentation: I have reviewed the above documentation for accuracy and completeness,  and I agree with the above.  Sarina Ser, MD

## 2022-11-04 NOTE — Patient Instructions (Addendum)
Start Claritin or Zyrtec 1 to 3 pills a day, start out with 1 pill and if not improving may increase to 2 pills then if still not improving increase to 3 pills a day   Due to recent changes in healthcare laws, you may see results of your pathology and/or laboratory studies on MyChart before the doctors have had a chance to review them. We understand that in some cases there may be results that are confusing or concerning to you. Please understand that not all results are received at the same time and often the doctors may need to interpret multiple results in order to provide you with the best plan of care or course of treatment. Therefore, we ask that you please give [place] 2 business days to thoroughly review all your results before contacting the office for clarification. Should we see a critical lab result, you will be contacted sooner.   If You Need Anything After Your Visit  If you have any questions or concerns for your doctor, please call our main line at [phone] and press option 4 to reach your doctor's medical assistant. If no one answers, please leave a voicemail as directed and we will return your call as soon as possible. Messages left after 4 pm will be answered the following business day.   You may also send [place] a message via [place]. We typically respond to MyChart messages within 1-2 business days.  For prescription refills, please ask your pharmacy to contact our office. Our fax number is [phone].  If you have an urgent issue when the clinic is closed that cannot wait until the next business day, you can page your doctor at the number below.    Please note that while we do our best to be available for urgent issues outside of office hours, we are not available 24/7.   If you have an urgent issue and are unable to reach [place], you may choose to seek medical care at your doctor's office, retail clinic, urgent care center, or emergency room.  If you have a medical emergency,  please immediately call 911 or go to the emergency department.  Pager Numbers  - Dr. Nehemiah Massed: [phone]  - Dr. Laurence Ferrari: [phone]  - Dr. Nicole Kindred: [phone]  In the event of inclement weather, please call our main line at [phone] for an update on the status of any delays or closures.  Dermatology Medication Tips: Please keep the boxes that topical medications come in in order to help keep track of the instructions about where and how to use these. Pharmacies typically print the medication instructions only on the boxes and not directly on the medication tubes.   If your medication is too expensive, please contact our office at [phone] option 4 or send [place] a message through [place].   We are unable to tell what your co-pay for medications will be in advance as this is different depending on your insurance coverage. However, we may be able to find a substitute medication at lower cost or fill out paperwork to get insurance to cover a needed medication.   If a prior authorization is required to get your medication covered by your insurance company, please allow [place] 1-2 business days to complete this process.  Drug prices often vary depending on where the prescription is filled and some pharmacies may offer cheaper prices.  The website www.goodrx.com contains coupons for medications through different pharmacies. The prices here do not account for what the cost may be with  help from insurance (it may be cheaper with your insurance), but the website can give you the price if you did not use any insurance.  - You can print the associated coupon and take it with your prescription to the pharmacy.  - You may also stop by our office during regular business hours and pick up a GoodRx coupon card.  - If you need your prescription sent electronically to a different pharmacy, notify our office through [facility] or by phone at [phone] option 4.     Si Usted Necesita Algo  Despus de Su Visita  Tambin puede enviarnos un mensaje a travs de Pharmacist, community. Por lo general respondemos a los mensajes de MyChart en el transcurso de 1 a 2 das hbiles.  Para renovar recetas, por favor pida a su farmacia que se ponga en contacto con nuestra oficina. Harland Dingwall de fax es [place] [phone].  Si tiene un asunto urgente cuando la clnica est cerrada y que no puede esperar hasta el siguiente da hbil, puede llamar/localizar a su doctor(a) al nmero que aparece a continuacin.   Por favor, tenga en cuenta que aunque hacemos todo lo posible para estar disponibles para asuntos urgentes fuera del horario de [place], no estamos disponibles las 24 horas del da, los 7 das de la [place].   Si tiene un problema urgente y no puede comunicarse con nosotros, puede optar por buscar atencin mdica  en el consultorio de su doctor(a), en una clnica privada, en un centro de atencin urgente o en una sala de emergencias.  Si tiene Engineering geologist, por favor llame inmediatamente al 911 o vaya a la sala de emergencias.  Nmeros de bper  - Dr. Nehemiah Massed: [phone]  - Dra. Moye: [phone]  - Dra. Nicole Kindred: [phone]  En caso de inclemencias del [place], por favor llame a Johnsie Kindred principal al [phone] para una actualizacin sobre el [place] de cualquier retraso o cierre.  Consejos para la medicacin en dermatologa: Por favor, guarde las cajas en las que vienen los medicamentos de uso tpico para ayudarle a seguir las instrucciones sobre dnde y cmo usarlos. Las farmacias generalmente imprimen las instrucciones del medicamento slo en las cajas y no directamente en los tubos del [place].   Si su medicamento es muy caro, por favor, pngase en contacto con Zigmund Daniel llamando al [phone] y presione la opcin 4 o envenos un mensaje a travs de Pharmacist, community.   No podemos decirle cul ser su copago por los medicamentos por adelantado ya que esto es diferente  dependiendo de la cobertura de su seguro. Sin embargo, es posible que podamos encontrar un medicamento sustituto a Electrical engineer un formulario para que el seguro cubra el medicamento que se considera necesario.   Si se requiere una autorizacin previa para que su compaa de seguros [place] su medicamento, por favor permtanos de 1 a 2 das hbiles para completar este proceso.  Los precios de los medicamentos varan con frecuencia dependiendo del Environmental consultant de dnde se surte la receta y alguna farmacias pueden ofrecer precios ms baratos.  El sitio web www.goodrx.com tiene cupones para medicamentos de Airline pilot. Los precios aqu no tienen en cuenta lo que podra costar con la ayuda del seguro (puede ser ms barato con su seguro), pero el sitio web puede darle el precio si no utiliz Research scientist (physical sciences).  - Puede imprimir el cupn correspondiente y llevarlo con su receta a la farmacia.  - Tambin puede pasar por nuestra oficina durante el  horario de Freight forwarder regular y Charity fundraiser una tarjeta de cupones de GoodRx.  - Si necesita que su receta se enve electrnicamente a una farmacia diferente, informe a nuestra oficina a travs de MyChart de [place] o por telfono llamando al [phone] y presione la opcin 4.

## 2022-11-12 ENCOUNTER — Encounter: Payer: Self-pay | Admitting: Dermatology

## 2022-11-18 ENCOUNTER — Ambulatory Visit: Payer: Medicaid Other | Admitting: Dermatology

## 2022-11-22 ENCOUNTER — Ambulatory Visit (INDEPENDENT_AMBULATORY_CARE_PROVIDER_SITE_OTHER): Payer: Medicaid Other

## 2022-11-22 DIAGNOSIS — L209 Atopic dermatitis, unspecified: Secondary | ICD-10-CM | POA: Diagnosis not present

## 2022-11-22 MED ORDER — DUPIXENT 300 MG/2ML ~~LOC~~ SOSY: 300.0000 mg | PREFILLED_SYRINGE | SUBCUTANEOUS | 0 refills | Status: AC

## 2022-11-22 NOTE — Progress Notes (Signed)
Patient here today for two week Dupixent injection for severe Atopic Dermatitis.    Dupixent 379m injected into left upper arm. Patient tolerated well.    LOT: [id number]: 01/2025 NDC: [id number]  AJohnsie Kindred RMA

## 2022-12-06 ENCOUNTER — Ambulatory Visit (INDEPENDENT_AMBULATORY_CARE_PROVIDER_SITE_OTHER): Payer: Medicaid Other | Admitting: Dermatology

## 2022-12-06 ENCOUNTER — Encounter: Payer: Self-pay | Admitting: Dermatology

## 2022-12-06 VITALS — BP 123/81 | HR 70

## 2022-12-06 DIAGNOSIS — L732 Hidradenitis suppurativa: Secondary | ICD-10-CM

## 2022-12-06 DIAGNOSIS — Z79899 Other long term (current) drug therapy: Secondary | ICD-10-CM

## 2022-12-06 DIAGNOSIS — Z9109 Other allergy status, other than to drugs and biological substances: Secondary | ICD-10-CM

## 2022-12-06 DIAGNOSIS — L209 Atopic dermatitis, unspecified: Secondary | ICD-10-CM | POA: Diagnosis not present

## 2022-12-06 MED ORDER — DUPILUMAB 300 MG/2ML ~~LOC~~ SOSY
300.0000 mg | PREFILLED_SYRINGE | Freq: Once | SUBCUTANEOUS | Status: AC
  Administered 2022-12-06: 300 mg via SUBCUTANEOUS

## 2022-12-06 MED ORDER — DOXYCYCLINE HYCLATE 20 MG PO TABS: ORAL_TABLET | ORAL | 3 refills | Status: AC

## 2022-12-06 NOTE — Progress Notes (Signed)
Follow-Up Visit   Subjective  Angela Spencer is a 32 y.o. female who presents for the following: atopic neurodermatitis (2 week atopic derm follow up, patient currently treating with dupixent injections biweekly. Reports some spots at chest and redness at eyes but states has improved and less itchy. ) and Other (Patient reports history of painful bumps under arms and at groin. She would like to discuss treatment. She currently has no active bumps today.). The patient has spots, moles and lesions to be evaluated, some may be new or changing and the patient has concerns that these could be cancer.  The following portions of the chart were reviewed this encounter and updated as appropriate:  Tobacco  Allergies  Meds  Problems  Med Hx  Surg Hx  Fam Hx     Review of Systems: No other skin or systemic complaints except as noted in HPI or Assessment and Plan.  Objective  Well appearing patient in no apparent distress; mood and affect are within normal limits.  A focused examination was performed including chest, eyes, arms, b/l axilla. Relevant physical exam findings are noted in the Assessment and Plan.  chest, eyes Flared at eyes and chest today  b/l axilla and groin Scarring at b/l axilla / groin   Assessment & Plan  Atopic dermatitis, unspecified type chest, eyes Severe with dyschromia and edema and excoriations Patient notices less itching after initiating Dupixent 08/19/2022 Has a little flare at eyes still overall improved since starting Dupixent Chronic and persistent condition with duration or expected duration over one year. Condition is symptomatic / bothersome to patient. Not to goal, but improving. Atopic dermatitis (eczema) is a chronic, relapsing, pruritic condition that can significantly affect quality of life. It is often associated with allergic rhinitis and/or asthma and can require treatment with topical medications, phototherapy, or in severe cases biologic  injectable medication (Dupixent; Adbry) or Oral JAK inhibitors.    Patient is allergic to nickel    Cont Dupixent '300mg'$ /73m sq injections q 2 wks, Dupixent to L upper arm today, Lot # [id number] 01/2025 NDC [id number] TMC 0.1% oint qd up to 5d/wk aa eczema, avoid f/g/a Cont Tacrolimus 0.1% oint qd/bid aa eczema face/body Cont HC 2.5% cr qd up to 5d/wk aa eczema face Cont  Claritin 1-3 po qd   Topical steroids (such as triamcinolone, fluocinolone, fluocinonide, mometasone, clobetasol, halobetasol, betamethasone, hydrocortisone) can cause thinning and lightening of the skin if they are used for too long in the same area. Your physician has selected the right strength medicine for your problem and area affected on the body. Please use your medication only as directed by your physician to prevent side effects.     Dupilumab (Dupixent) is a treatment given by injection for adults and children with moderate-to-severe atopic dermatitis. Goal is control of skin condition, not cure. It is given as 2 injections at the first dose followed by 1 injection ever 2 weeks thereafter.  Young children are dosed monthly.   Potential side effects include allergic reaction, herpes infections, injection site reactions and conjunctivitis (inflammation of the eyes).  The use of Dupixent requires long term medication management, including periodic office visits.    dupilumab (DUPIXENT) prefilled syringe 300 mg - chest, eyes  Hidradenitis suppurativa b/l axilla and groin Hidradenitis Suppurativa is a chronic; persistent; non-curable, but treatable condition due to abnormal inflamed sweat glands in the body folds (axilla, inframammary, groin, medial thighs), causing recurrent painful draining cysts and scarring. It can be  associated with severe scarring acne and cysts; also abscesses and scarring of scalp. The goal is control and prevention of flares, as it is not curable. Scars are permanent and can be thickened.  Treatment may include daily use of topical medication and oral antibiotics.  Oral isotretinoin may also be helpful.  For some cases, Humira or Cosentyx (biologic injections) may be prescribed to decrease the inflammatory process and prevent flares.  When indicated, inflamed cysts may also be treated surgically.  Start doxycycline 20 mg tab - take 1 tab po with evening meal and drink Will consider clindamycin solution in future to apply to axilla and groin   Doxycycline should be taken with food to prevent nausea. Do not lay down for 30 minutes after taking. Be cautious with sun exposure and use good sun protection while on this medication. Pregnant women should not take this medication.   doxycycline (PERIOSTAT) 20 MG tablet - b/l axilla and groin Take 1 tab by mouth with evening meal.  Do not lay down for 30 minutes after taking. Be cautious with sun exposure and use good sun protection while on this medication. Pregnant women should not take this medication.  Return for 2 week dupixent nurse visit,  6 week atopic follow up.  IRuthell Rummage, CMA, am acting as scribe for Sarina Ser, MD. Documentation: I have reviewed the above documentation for accuracy and completeness, and I agree with the above.  Sarina Ser, MD

## 2022-12-06 NOTE — Patient Instructions (Addendum)
Dupilumab (Dupixent) is a treatment given by injection for adults and children with moderate-to-severe atopic dermatitis. Goal is control of skin condition, not cure. It is given as 2 injections at the first dose followed by 1 injection ever 2 weeks thereafter.  Young children are dosed monthly.  Potential side effects include allergic reaction, herpes infections, injection site reactions and conjunctivitis (inflammation of the eyes).  The use of Dupixent requires long term medication management, including periodic office visits.     Hidradenitis Suppurativa is a chronic; persistent; non-curable, but treatable condition due to abnormal inflamed sweat glands in the body folds (axilla, inframammary, groin, medial thighs), causing recurrent painful draining cysts and scarring. It can be associated with severe scarring acne and cysts; also abscesses and scarring of scalp. The goal is control and prevention of flares, as it is not curable. Scars are permanent and can be thickened. Treatment may include daily use of topical medication and oral antibiotics.  Oral isotretinoin may also be helpful.  For some cases, Humira or Cosentyx (biologic injections) may be prescribed to decrease the inflammatory process and prevent flares.  When indicated, inflamed cysts may also be treated surgically.      Due to recent changes in healthcare laws, you may see results of your pathology and/or laboratory studies on MyChart before the doctors have had a chance to review them. We understand that in some cases there may be results that are confusing or concerning to you. Please understand that not all results are received at the same time and often the doctors may need to interpret multiple results in order to provide you with the best plan of care or course of treatment. Therefore, we ask that you please give [place] 2 business days to thoroughly review all your results before contacting the office for clarification. Should we see  a critical lab result, you will be contacted sooner.   If You Need Anything After Your Visit  If you have any questions or concerns for your doctor, please call our main line at [phone] and press option 4 to reach your doctor's medical assistant. If no one answers, please leave a voicemail as directed and we will return your call as soon as possible. Messages left after 4 pm will be answered the following business day.   You may also send [place] a message via [place]. We typically respond to MyChart messages within 1-2 business days.  For prescription refills, please ask your pharmacy to contact our office. Our fax number is [phone].  If you have an urgent issue when the clinic is closed that cannot wait until the next business day, you can page your doctor at the number below.    Please note that while we do our best to be available for urgent issues outside of office hours, we are not available 24/7.   If you have an urgent issue and are unable to reach [place], you may choose to seek medical care at your doctor's office, retail clinic, urgent care center, or emergency room.  If you have a medical emergency, please immediately call 911 or go to the emergency department.  Pager Numbers  - Dr. Nehemiah Massed: [phone]  - Dr. Laurence Ferrari: [phone]  - Dr. Nicole Kindred: [phone]  In the event of inclement weather, please call our main line at [phone] for an update on the status of any delays or closures.  Dermatology Medication Tips: Please keep the boxes that topical medications come in in order to help keep track of the  instructions about where and how to use these. Pharmacies typically print the medication instructions only on the boxes and not directly on the medication tubes.   If your medication is too expensive, please contact our office at [phone] option 4 or send [place] a message through [place].   We are unable to tell what your co-pay for medications will be in advance as  this is different depending on your insurance coverage. However, we may be able to find a substitute medication at lower cost or fill out paperwork to get insurance to cover a needed medication.   If a prior authorization is required to get your medication covered by your insurance company, please allow [place] 1-2 business days to complete this process.  Drug prices often vary depending on where the prescription is filled and some pharmacies may offer cheaper prices.  The website www.goodrx.com contains coupons for medications through different pharmacies. The prices here do not account for what the cost may be with help from insurance (it may be cheaper with your insurance), but the website can give you the price if you did not use any insurance.  - You can print the associated coupon and take it with your prescription to the pharmacy.  - You may also stop by our office during regular business hours and pick up a GoodRx coupon card.  - If you need your prescription sent electronically to a different pharmacy, notify our office through [facility] or by phone at [phone] option 4.     Si Usted Necesita Algo Despus de Su Visita  Tambin puede enviarnos un mensaje a travs de Pharmacist, community. Por lo general respondemos a los mensajes de MyChart en el transcurso de 1 a 2 das hbiles.  Para renovar recetas, por favor pida a su farmacia que se ponga en contacto con nuestra oficina. Harland Dingwall de fax es [place] [phone].  Si tiene un asunto urgente cuando la clnica est cerrada y que no puede esperar hasta el siguiente da hbil, puede llamar/localizar a su doctor(a) al nmero que aparece a continuacin.   Por favor, tenga en cuenta que aunque hacemos todo lo posible para estar disponibles para asuntos urgentes fuera del horario de [place], no estamos disponibles las 24 horas del da, los 7 das de la [place].   Si tiene un problema urgente y no puede comunicarse con nosotros, puede optar por  buscar atencin mdica  en el consultorio de su doctor(a), en una clnica privada, en un centro de atencin urgente o en una sala de emergencias.  Si tiene Engineering geologist, por favor llame inmediatamente al 911 o vaya a la sala de emergencias.  Nmeros de bper  - Dr. Nehemiah Massed: [phone]  - Dra. Moye: [phone]  - Dra. Nicole Kindred: [phone]  En caso de inclemencias del [place], por favor llame a Johnsie Kindred principal al [phone] para una actualizacin sobre el [place] de cualquier retraso o cierre.  Consejos para la medicacin en dermatologa: Por favor, guarde las cajas en las que vienen los medicamentos de uso tpico para ayudarle a seguir las instrucciones sobre dnde y cmo usarlos. Las farmacias generalmente imprimen las instrucciones del medicamento slo en las cajas y no directamente en los tubos del [place].   Si su medicamento es muy caro, por favor, pngase en contacto con Zigmund Daniel llamando al [phone] y presione la opcin 4 o envenos un mensaje a travs de Pharmacist, community.   No podemos decirle cul ser su copago por los medicamentos por adelantado ya  que esto es diferente dependiendo de la cobertura de su seguro. Sin embargo, es posible que podamos encontrar un medicamento sustituto a Electrical engineer un formulario para que el seguro cubra el medicamento que se considera necesario.   Si se requiere una autorizacin previa para que su compaa de seguros [place] su medicamento, por favor permtanos de 1 a 2 das hbiles para completar este proceso.  Los precios de los medicamentos varan con frecuencia dependiendo del Environmental consultant de dnde se surte la receta y alguna farmacias pueden ofrecer precios ms baratos.  El sitio web www.goodrx.com tiene cupones para medicamentos de Airline pilot. Los precios aqu no tienen en cuenta lo que podra costar con la ayuda del seguro (puede ser ms barato con su seguro), pero el sitio web puede darle el precio si no  utiliz Research scientist (physical sciences).  - Puede imprimir el cupn correspondiente y llevarlo con su receta a la farmacia.  - Tambin puede pasar por nuestra oficina durante el horario de atencin regular y Charity fundraiser una tarjeta de cupones de GoodRx.  - Si necesita que su receta se enve electrnicamente a una farmacia diferente, informe a nuestra oficina a travs de MyChart de [place] o por telfono llamando al [phone] y presione la opcin 4.

## 2022-12-14 ENCOUNTER — Encounter: Payer: Self-pay | Admitting: Dermatology

## 2022-12-20 ENCOUNTER — Ambulatory Visit: Payer: Medicaid Other

## 2023-01-01 ENCOUNTER — Other Ambulatory Visit: Payer: Self-pay | Admitting: Dermatology

## 2023-01-01 DIAGNOSIS — L2081 Atopic neurodermatitis: Secondary | ICD-10-CM

## 2023-01-17 ENCOUNTER — Ambulatory Visit: Payer: Medicaid Other | Admitting: Dermatology

## 2023-03-14 ENCOUNTER — Other Ambulatory Visit: Payer: Self-pay | Admitting: Internal Medicine

## 2023-03-16 ENCOUNTER — Ambulatory Visit: Payer: Medicaid Other | Admitting: Family

## 2023-05-06 ENCOUNTER — Other Ambulatory Visit: Payer: Self-pay | Admitting: Internal Medicine

## 2023-08-03 ENCOUNTER — Other Ambulatory Visit: Payer: Self-pay | Admitting: Internal Medicine

## 2023-10-31 LAB — LAB REPORT - SCANNED
EGFR: 123
TSH: 0.743

## 2024-02-09 NOTE — Progress Notes (Deleted)
   [address] [place] [address] Dept: [phone]  FOLLOW UP NOTE  Patient ID: Angela Spencer, female    DOB: 03-26-1991  Age: 33 y.o. MRN: [id number] Date of Office Visit: 02/10/2024  Assessment  Chief Complaint: No chief complaint on file.  HPI Angela Spencer   Discussed the use of AI scribe software for clinical note transcription with the patient, who gave verbal consent to proceed.  History of Present Illness      Drug Allergies:  Allergies  Allergen Reactions   Flexeril [Cyclobenzaprine Hcl] Hives, Shortness Of Breath and Other (See Comments)    Sweating   Penicillins Hives, Itching and Other (See Comments)    Pt cannot remember reaction   Tramadol Hives    Physical Exam: There were no vitals taken for this visit.   Physical Exam  Diagnostics:    Assessment and Plan: No diagnosis found.  No orders of the defined types were placed in this encounter.   There are no Patient Instructions on file for this visit.  No follow-ups on file.    Thank you for the opportunity to care for this patient.  Please do not hesitate to contact me with questions.  Marinus Sic, FNP [facility]

## 2024-02-10 ENCOUNTER — Ambulatory Visit: Payer: MEDICAID | Admitting: Family Medicine

## 2024-03-07 NOTE — Progress Notes (Unsigned)
   [address] [place] [address] Dept: [phone]  FOLLOW UP NOTE  Patient ID: Angela Spencer, female    DOB: 01-08-1991  Age: 33 y.o. MRN: [id number] Date of Office Visit: 03/09/2024  Assessment  Chief Complaint: No chief complaint on file.  HPI Angela Spencer is a 33 year old female who presents to the clinic for follow-up visit.  She was last seen in this clinic on 08/02/2022 as a new patient by Dr. Lydia Sams for evaluation of asthma, allergic rhinitis, conjunctivitis with a rash around both eyes, and atopic dermatitis.  Discussed the use of AI scribe software for clinical note transcription with the patient, who gave verbal consent to proceed.  History of Present Illness      Drug Allergies:  Allergies  Allergen Reactions   Flexeril [Cyclobenzaprine Hcl] Hives, Shortness Of Breath and Other (See Comments)    Sweating   Penicillins Hives, Itching and Other (See Comments)    Pt cannot remember reaction   Tramadol Hives    Physical Exam: There were no vitals taken for this visit.   Physical Exam  Diagnostics:    Assessment and Plan: No diagnosis found.  No orders of the defined types were placed in this encounter.   There are no Patient Instructions on file for this visit.  No follow-ups on file.    Thank you for the opportunity to care for this patient.  Please do not hesitate to contact me with questions.  Marinus Sic, FNP [facility]

## 2024-03-07 NOTE — Patient Instructions (Signed)
 Asthma Begin Dulera  200-2 puffs twice a day with a spacer to prevent cough or wheeze Begin montelukast  10 mg once a day to prevent cough or wheeze. Patient cautioned that rarely some children/adults can experience behavioral changes after beginning montelukast . These side effects are rare, however, if you notice any change, notify the clinic and discontinue montelukast . Continue albuterol  2 puffs once every 4 hours if needed for cough or wheeze You may use albuterol  2 puffs 5 to 15 minutes before activity to decrease cough or wheeze  Allergic rhinitis A lab order has been entered to help us  evaluate your environmental allergies.  We will call you when the lab result becomes available Continue an antihistamine once a day if needed for runny nose or itch. Remember to rotate to a different antihistamine about every 3 months. Some examples of over the counter antihistamines include Zyrtec (cetirizine), Xyzal (levocetirizine), Allegra (fexofenadine), and Claritin (loratidine).  Begin Flonase  2 sprays in each nostril once a day if needed for stuffy nose.  In the right nostril, point the applicator out toward the right ear. In the left nostril, point the applicator out toward the left ear Consider saline nasal rinses as needed for nasal symptoms. Use this before any medicated nasal sprays for best result  Conjunctivitis with rash Continue to follow-up with your ophthalmologist  Atopic dermatitis Continue twice a day moisturizing routine Begin tacrolimus  to reddened itchy areas up to twice a day Begin hydrocortisone  2.5% cream to reddened itchy areas up to twice a day if needed For stubborn red itchy areas, begin triamcinolone  0.1% ointment up to twice a day if needed.  Do not use this medication longer than 2 weeks in a row. Consider Dupixent  if your symptoms are not well-controlled with the treatment plan as listed above  Call the clinic if this treatment plan is not working well for you.  Follow  up in 1 month or sooner if needed.

## 2024-03-09 ENCOUNTER — Other Ambulatory Visit: Payer: Self-pay

## 2024-03-09 ENCOUNTER — Encounter: Payer: Self-pay | Admitting: Family Medicine

## 2024-03-09 ENCOUNTER — Ambulatory Visit: Admitting: Family Medicine

## 2024-03-09 VITALS — BP 124/72 | HR 105 | Temp 98.0°F | Resp 18 | Ht 62.21 in | Wt 188.0 lb

## 2024-03-09 DIAGNOSIS — L2089 Other atopic dermatitis: Secondary | ICD-10-CM | POA: Diagnosis not present

## 2024-03-09 DIAGNOSIS — J310 Chronic rhinitis: Secondary | ICD-10-CM | POA: Diagnosis not present

## 2024-03-09 DIAGNOSIS — J4540 Moderate persistent asthma, uncomplicated: Secondary | ICD-10-CM

## 2024-03-09 DIAGNOSIS — H1013 Acute atopic conjunctivitis, bilateral: Secondary | ICD-10-CM

## 2024-03-09 DIAGNOSIS — R21 Rash and other nonspecific skin eruption: Secondary | ICD-10-CM | POA: Diagnosis not present

## 2024-03-09 DIAGNOSIS — L2081 Atopic neurodermatitis: Secondary | ICD-10-CM

## 2024-03-09 MED ORDER — HYDROCORTISONE 2.5 % EX CREA: TOPICAL_CREAM | CUTANEOUS | 1 refills | Status: DC

## 2024-03-09 MED ORDER — MONTELUKAST SODIUM 10 MG PO TABS: 10.0000 mg | ORAL_TABLET | Freq: Every day | ORAL | 5 refills | Status: DC

## 2024-03-09 MED ORDER — TACROLIMUS 0.1 % EX OINT: TOPICAL_OINTMENT | Freq: Two times a day (BID) | CUTANEOUS | 0 refills | Status: DC

## 2024-03-09 MED ORDER — DULERA 200-5 MCG/ACT IN AERO: 2.0000 | INHALATION_SPRAY | Freq: Two times a day (BID) | RESPIRATORY_TRACT | 5 refills | Status: DC

## 2024-03-09 MED ORDER — ALBUTEROL SULFATE HFA 108 (90 BASE) MCG/ACT IN AERS: 2.0000 | INHALATION_SPRAY | RESPIRATORY_TRACT | 3 refills | Status: DC | PRN

## 2024-03-09 NOTE — Addendum Note (Signed)
 Addended by: Mollie Anger on: 03/09/2024 03:57 PM   Modules accepted: Orders

## 2024-03-13 LAB — LAB REPORT - SCANNED
EGFR: 87
TSH: 0.782

## 2024-04-11 ENCOUNTER — Ambulatory Visit: Admitting: Allergy & Immunology

## 2024-05-07 ENCOUNTER — Ambulatory Visit (INDEPENDENT_AMBULATORY_CARE_PROVIDER_SITE_OTHER): Admitting: Internal Medicine

## 2024-05-07 ENCOUNTER — Other Ambulatory Visit: Payer: Self-pay

## 2024-05-07 VITALS — BP 108/64 | HR 94 | Temp 98.0°F | Resp 18 | Ht 62.6 in | Wt 184.6 lb

## 2024-05-07 DIAGNOSIS — L2084 Intrinsic (allergic) eczema: Secondary | ICD-10-CM

## 2024-05-07 DIAGNOSIS — J3089 Other allergic rhinitis: Secondary | ICD-10-CM

## 2024-05-07 DIAGNOSIS — J4540 Moderate persistent asthma, uncomplicated: Secondary | ICD-10-CM | POA: Diagnosis not present

## 2024-05-07 MED ORDER — MONTELUKAST SODIUM 10 MG PO TABS: 10.0000 mg | ORAL_TABLET | Freq: Every day | ORAL | 5 refills | Status: DC

## 2024-05-07 MED ORDER — MOMETASONE FUROATE 0.1 % EX OINT: TOPICAL_OINTMENT | CUTANEOUS | 5 refills | Status: AC

## 2024-05-07 MED ORDER — CETIRIZINE HCL 10 MG PO TABS: 10.0000 mg | ORAL_TABLET | Freq: Every day | ORAL | 5 refills | Status: DC

## 2024-05-07 MED ORDER — TACROLIMUS 0.1 % EX OINT: TOPICAL_OINTMENT | Freq: Two times a day (BID) | CUTANEOUS | 5 refills | Status: DC

## 2024-05-07 MED ORDER — ALBUTEROL SULFATE HFA 108 (90 BASE) MCG/ACT IN AERS: 1.0000 | INHALATION_SPRAY | RESPIRATORY_TRACT | 1 refills | Status: DC | PRN

## 2024-05-07 MED ORDER — HYDROCORTISONE 2.5 % EX CREA: TOPICAL_CREAM | CUTANEOUS | 1 refills | Status: DC

## 2024-05-07 MED ORDER — DULERA 200-5 MCG/ACT IN AERO: 2.0000 | INHALATION_SPRAY | Freq: Two times a day (BID) | RESPIRATORY_TRACT | 5 refills | Status: DC

## 2024-05-07 MED ORDER — FLUTICASONE PROPIONATE 50 MCG/ACT NA SUSP: 2.0000 | Freq: Every day | NASAL | 3 refills | Status: DC

## 2024-05-07 NOTE — Patient Instructions (Addendum)
 Moderate Persistent Asthma: - Maintenance inhaler: continue Dulera  2 puffs twice daily and Singulair  10mg  daily.  - Rescue inhaler: Albuterol  2 puffs via spacer or 1 vial via nebulizer every 4-6 hours as needed for respiratory symptoms of cough, shortness of breath, or wheezing Asthma control goals:  Full participation in all desired activities (may need albuterol  before activity) Albuterol  use two times or less a week on average (not counting use with activity) Cough interfering with sleep two times or less a month Oral steroids no more than once a year No hospitalizations   Other Allergic Rhinitis: - Use nasal saline rinses before nose sprays such as with Neilmed Sinus Rinse.  Use distilled water.   - Use Flonase  2 sprays each nostril daily. Aim upward and outward. - Use Zyrtec  10 mg daily.  - Use Singulair  10mg  daily. Stop if there are any mood/behavioral changes.  Eczema: - Do a daily soaking tub bath in warm water for 10-15 minutes.  - Use a gentle, unscented cleanser at the end of the bath (such as Dove unscented bar or baby wash, or Aveeno sensitive body wash). Then rinse, pat half-way dry, and apply a gentle, unscented moisturizer cream or ointment (Cerave, Cetaphil, Eucerin, Aveeno, Aquaphor, Vanicream, Vaseline)  all over while still damp. Dry skin makes the itching and rash of eczema worse. The skin should be moisturized with a gentle, unscented moisturizer at least twice daily.  - Use only unscented liquid laundry detergent. - Apply prescribed topical steroid (mometasone  0.1% below neck or hydrocortisone  2.5% above neck) to flared areas (red and thickened eczema) after the moisturizer has soaked into the skin (wait at least 30 minutes). Taper off the topical steroids as the skin improves. Do not use topical steroid for more than 7-10 days at a time.  - Put Protopic  onto areas of rough eczema twice a day. May decrease to once a day as the eczema improves. This will not thin the skin,  and is safe for chronic use. Do not put this onto normal appearing skin. - Restart Dupixent  600mg  x1 and then 300mg  every 2 weeks.     Hold all anti-histamines (Xyzal, Allegra, Zyrtec , Claritin, Benadryl , Pepcid) 3 days prior to next visit.  Follow up: 8/4 at 930 for skin testing 1-55  and 2 month follow up

## 2024-05-07 NOTE — Progress Notes (Signed)
 FOLLOW UP Date of Service/Encounter:  05/07/24   Subjective:  Angela Spencer (DOB: 1991-04-07) is a 33 y.o. female who [facility] for follow up for asthma, allergic rhinitis, eczema.   History obtained from: chart review and patient. Last seen by Arle[facility]- uncontrolled, started on Dulera , continue Singulair .   Rhinitis- OTC nasal spray, uncontrolled, discussed allergy testing by bloodwork. Use Flonase  and anti histamine.  Followed by Optho for corneal ulcer/bacterial keratitis, on abx eye drops.  Eczema- start Protopic , consider restarting Dupixent  which she was on previously with Dermatology   Asthma is doing better.  Reports less episodes of SOB/cough.  On Dulera  BID.  Still using Albuterol  about daily.  Was doing better when she was on Dupixent  with dermatology but she is no longer followed by them or on Dupixent .  Allergies are uncontrolled, notes frequent congestion/drainage/runny nose/ear fullness and popping. On Flonase , Zyrtec , Singulair .  Did not get labwork done for allergy testing.   Eczema is uncontrolled, still has a persistent outbreak on forearm (L).  Followed by Dermatology previously but now is switching care and wont see them until September.  Rash around eyes is persistent too.  Was slowly improving on Dupixent .  Using triamcinolone , protopic , hydrocortisone  without relief. Triamcinolone  has also caused some worsening so she is not using it; has not tried an alternative topical steroid.   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 Physical Exam: GEN: alert, well developed HEENT: clear conjunctiva, nose with moderate inferior turbinate hypertrophy, pink nasal mucosa, + clear rhinorrhea, + cobblestoning HEART: regular rate and rhythm, no murmur LUNGS: clear to auscultation bilaterally, no coughing, unlabored respiration SKIN: + dry dermatitis around bl eyes; + circular lesion with erythema and dryness on L forearm   Spirometry:  Tracings reviewed. Her effort: Good reproducible efforts. FVC: 3.47L, 111% predicted  FEV1: 2.55L, 96% predicted FEV1/FVC ratio: 73% Interpretation: Spirometry consistent with normal pattern.  Please see scanned spirometry results for details.  Assessment:   1. Other allergic rhinitis   2. Intrinsic atopic dermatitis   3. Moderate persistent asthma without complication     Plan/Recommendations:  Moderate Persistent Asthma: - MDI technique discussed.  Spirometry today was normal. Improved but uncontrolled, will uptitrate therapy with Dupixent  for uncontrolled asthma and eczema.  - Maintenance inhaler: continue Dulera  2 puffs twice daily and Singulair  10mg  daily.  - Rescue inhaler: Albuterol  2 puffs via spacer or 1 vial via nebulizer every 4-6 hours as needed for respiratory symptoms of cough, shortness of breath, or wheezing Asthma control goals:  Full participation in all desired activities (may need albuterol  before activity) Albuterol  use two times or less a week on average (not counting  use with activity) Cough interfering with sleep two times or less a month Oral steroids no more than once a year No hospitalizations   Other Allergic Rhinitis: - Uncontrolled.  Due to turbinate hypertrophy, seasonal symptoms and unresponsive to over the counter meds, will perform skin testing to identify aeroallergen  triggers at next visit.  - Use nasal saline rinses before nose sprays such as with Neilmed Sinus Rinse.  Use distilled water.   - Use Flonase  2 sprays each nostril daily. Aim upward and outward. - Use Zyrtec  10 mg daily.  - Use Singulair  10mg  daily. Stop if there are any mood/behavioral changes.  Eczema: - Uncontrolled despite use of topical steroids and Protopic , restart Dupixent .  Outbreaks around eyes and forearms that is unresponsive to topical meds. Also stopi triamcinolone , try mometasone  topical.  - Do a daily soaking tub bath in warm water for 10-15 minutes.  - Use a gentle, unscented cleanser at the end of the bath (such as Dove unscented bar or baby wash, or Aveeno sensitive body wash). Then rinse, pat half-way dry, and apply a gentle, unscented moisturizer cream or ointment (Cerave, Cetaphil, Eucerin, Aveeno, Aquaphor, Vanicream, Vaseline)  all over while still damp. Dry skin makes the itching and rash of eczema worse. The skin should be moisturized with a gentle, unscented moisturizer at least twice daily.  - Use only unscented liquid laundry detergent. - Apply prescribed topical steroid (triamcinolone  0.1% below neck or hydrocortisone  2.5% above neck) to flared areas (red and thickened eczema) after the moisturizer has soaked into the skin (wait at least 30 minutes). Taper off the topical steroids as the skin improves. Do not use topical steroid for more than 7-10 days at a time.  - Put Protopic  onto areas of rough eczema twice a day. May decrease to once a day as the eczema improves. This will not thin the skin, and is safe for chronic use. Do not put this onto normal appearing skin. - Restart Dupixent  600mg  x1 and then 300mg  every 2 weeks.    Hold all anti-histamines (Xyzal, Allegra, Zyrtec , Claritin, Benadryl , Pepcid) 3 days prior to next visit.  Follow up: 8/4 at 930 for skin testing 1-55, IDs okay     Arleta Blanch, MD [facility] 

## 2024-05-14 ENCOUNTER — Ambulatory Visit: Admitting: Internal Medicine

## 2024-06-01 ENCOUNTER — Ambulatory Visit: Admitting: Allergy & Immunology

## 2024-06-22 ENCOUNTER — Ambulatory Visit: Admitting: Allergy & Immunology

## 2024-07-09 ENCOUNTER — Ambulatory Visit (INDEPENDENT_AMBULATORY_CARE_PROVIDER_SITE_OTHER): Admitting: Internal Medicine

## 2024-07-09 ENCOUNTER — Other Ambulatory Visit: Payer: Self-pay

## 2024-07-09 ENCOUNTER — Encounter: Payer: Self-pay | Admitting: Internal Medicine

## 2024-07-09 VITALS — BP 118/72 | HR 92 | Temp 97.2°F | Resp 18 | Ht 62.6 in | Wt 189.6 lb

## 2024-07-09 DIAGNOSIS — J4540 Moderate persistent asthma, uncomplicated: Secondary | ICD-10-CM

## 2024-07-09 DIAGNOSIS — L272 Dermatitis due to ingested food: Secondary | ICD-10-CM

## 2024-07-09 DIAGNOSIS — J3081 Allergic rhinitis due to animal (cat) (dog) hair and dander: Secondary | ICD-10-CM

## 2024-07-09 DIAGNOSIS — J301 Allergic rhinitis due to pollen: Secondary | ICD-10-CM

## 2024-07-09 DIAGNOSIS — L2084 Intrinsic (allergic) eczema: Secondary | ICD-10-CM

## 2024-07-09 DIAGNOSIS — J3089 Other allergic rhinitis: Secondary | ICD-10-CM | POA: Diagnosis not present

## 2024-07-09 MED ORDER — FLUTICASONE PROPIONATE 50 MCG/ACT NA SUSP: 2.0000 | Freq: Every day | NASAL | 1 refills | Status: DC

## 2024-07-09 MED ORDER — AZELASTINE HCL 0.1 % NA SOLN: 2.0000 | Freq: Two times a day (BID) | NASAL | 1 refills | Status: DC

## 2024-07-09 MED ORDER — MONTELUKAST SODIUM 10 MG PO TABS: 10.0000 mg | ORAL_TABLET | Freq: Every day | ORAL | 1 refills | Status: DC

## 2024-07-09 MED ORDER — DULERA 200-5 MCG/ACT IN AERO: 2.0000 | INHALATION_SPRAY | Freq: Two times a day (BID) | RESPIRATORY_TRACT | 1 refills | Status: DC

## 2024-07-09 MED ORDER — ALBUTEROL SULFATE HFA 108 (90 BASE) MCG/ACT IN AERS: 1.0000 | INHALATION_SPRAY | Freq: Four times a day (QID) | RESPIRATORY_TRACT | 1 refills | Status: DC | PRN

## 2024-07-09 MED ORDER — CETIRIZINE HCL 10 MG PO TABS: 10.0000 mg | ORAL_TABLET | Freq: Every day | ORAL | 1 refills | Status: AC

## 2024-07-09 NOTE — Progress Notes (Signed)
 FOLLOW UP Date of Service/Encounter:  07/09/24   Subjective:  Angela Spencer (DOB: 27-Nov-1990) is a 33 y.o. female who [facility] for follow up for skin testing.   History obtained from: chart review and patient.  Anti histamines held.  Has not seen Dermatology or Optho recently.  Derm had her on Dupixent  and she had noted some improvement with her eyes/rashes/itching but not significant and were considering changing therapy.    Past Medical History: Past Medical History:  Diagnosis Date   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; resolved Oct 2013 history of blunt force trauma    Angio-edema    Anxiety    Asthma    Depression    Eczema    Migraine headache    Multiple allergies    Psychotic disorder (HCC)    Seizures (HCC)    focal seizures   Stroke (HCC) 11/2020    Objective:  BP 118/72 (BP Location: Left Arm, Patient Position: Sitting, Cuff Size: Normal)   Pulse 92   Temp (!) 97.2 F (36.2 C) (Temporal)   Resp 18   Ht 5' 2.6 (1.59 m)   Wt 189 lb 9.6 oz (86 kg)   SpO2 97%   BMI 34.02 kg/m  Body mass index is 34.02 kg/m. Physical Exam: GEN: alert, well developed HEENT: clear conjunctiva, MMM LUNGS: unlabored respiration  Spirometry:  Tracings reviewed. Her effort: Good reproducible efforts. FVC: 3.6L, 119% FEV1: 2.74L, 107% predicted FEV1/FVC ratio: 76% Interpretation: Spirometry consistent with normal pattern.  Please see scanned spirometry results for details.  Skin Testing:  Skin prick testing was placed, which includes aeroallergens/foods, histamine control, and saline control.  Verbal consent was obtained prior to placing test.  Patient tolerated procedure well.  Allergy testing results were read and interpreted by myself, documented by clinical staff. Adequate positive and negative control.  Positive results to:  Results discussed with patient/family.  Airborne Adult Perc - 07/09/24 1015     Time Antigen Placed  1015    Allergen Manufacturer Jestine    Location Back    Number of Test 55    1. Control-Buffer 50% Glycerol Negative    2. Control-Histamine 3+    3. Bahia 3+    4. [place] 3+    5. Johnson 3+    6. [place]  Blue 3+    7. Meadow Fescue 3+    8. Perennial Rye 3+    9. Timothy 3+    10. Ragweed Mix Negative    11. Cocklebur Negative    12. Plantain,  English 2+    13. Baccharis 3+    14. Dog Fennel Negative    15. Russian Thistle Negative    16. Lamb's Quarters 3+    17. Sheep Sorrell 3+    18. Rough Pigweed Negative    19. Marsh Elder, Rough 2+    20. Mugwort, Common 3+    21. Box, Elder 3+    22. Cedar, red 2+    23. Sweet Gum 3+    24. Pecan Pollen 3+    25. Pine Mix Negative    26. Walnut, Black Pollen Negative    27. Red Mulberry Negative    28. Ash Mix 3+    29. Birch Mix 3+    30. Beech American 3+    31. Cottonwood, Eastern 3+    32. Hickory, White 3+    33. Maple Mix Negative    34. [place], [place]  Mix 3+    35. Sycamore Eastern Negative    36. Alternaria Alternata 2+    37. Cladosporium Herbarum 2+    38. Aspergillus Mix 3+    39. Penicillium Mix Negative    40. Bipolaris Sorokiniana (Helminthosporium) 2+    41. Drechslera Spicifera (Curvularia) 2+    42. Mucor Plumbeus Negative    43. Fusarium Moniliforme 3+    44. Aureobasidium Pullulans (pullulara) Negative    45. Rhizopus Oryzae Negative    46. Botrytis Cinera Negative    47. Epicoccum Nigrum Negative    48. Phoma Betae Negative    49. Dust Mite Mix 3+    50. Cat Hair 10,000 BAU/ml Negative    51.  Dog Epithelia Negative    52. Mixed Feathers Negative    53. Horse Epithelia Negative    54. Cockroach, German Negative    55. Tobacco Leaf Negative          13 Food Perc - 07/09/24 1015       Test Information   Time Antigen Placed 1015    Allergen Manufacturer Jestine    Location Back    Number of allergen test 13      Food   1. Peanut Negative    2. Soybean Negative    3. Wheat Negative     4. Sesame Negative    5. Milk, Cow Negative    6. Casein Negative    7. Egg White, Chicken Negative    8. Shellfish Mix Negative    9. Fish Mix Negative    10. Cashew Negative    11. Walnut Food Negative    12. Almond Negative    13. Hazelnut Negative          Intradermal - 07/09/24 1110     Time Antigen Placed 1055    Allergen Manufacturer Greer    Location Arm    Number of Test 5    Control Negative    Ragweed Mix 3+    Cat 3+    Dog 3+    Cockroach 3+           Assessment:   1. Seasonal allergic rhinitis due to pollen   2. Moderate persistent asthma without complication   3. Dermatitis due to ingested food   4. Intrinsic atopic dermatitis   5. Allergic rhinitis caused by mold   6. Allergic rhinitis due to dust mite   7. Allergic rhinitis due to insect   8. Allergic rhinitis due to animal hair or dander     Plan/Recommendations:  Allergic Rhinitis: - Due to turbinate hypertrophy, seasonal symptoms, uncontrolled asthma, eczema and unresponsive to over the counter meds, will perform skin testing to identify aeroallergen triggers at next visit.  - SPT 06/2024: positive to trees, grasses, weeds, molds, cats, dogs, dust mites, cockroach  - Avoidance measures discussed.  - Use nasal saline rinses before nose sprays such as with Neilmed Sinus Rinse.  Use distilled water.   - Use Flonase  2 sprays each nostril daily. Aim upward and outward. - Use Azelastine 2 sprays each nostril daily  Aim upward and outward.  - Use Zyrtec  10 mg daily.  - Use Singulair  10mg  daily. Stop if there are any mood/behavioral changes.  Moderate Persistent Asthma: - Maintenance inhaler: continue Dulera  2 puffs twice daily and Singulair  10mg  daily.  - Rescue inhaler: Albuterol  2 puffs via spacer or 1 vial via nebulizer every 4-6 hours as needed for respiratory symptoms of cough,  shortness of breath, or wheezing Asthma control goals:  Full participation in all desired activities (may need  albuterol  before activity) Albuterol  use two times or less a week on average (not counting use with activity) Cough interfering with sleep two times or less a month Oral steroids no more than once a year No hospitalizations  Eczema Hyperpigmentation around Eyes - SPT 06/2024: negative to commonly allergenic foods  - Follow up with Dermatology Dr Hester [phone].  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  - Do a daily soaking tub bath in warm water for 10-15 minutes.  - Use a gentle, unscented cleanser at the end of the bath (such as Dove unscented bar or baby wash, or Aveeno sensitive body wash). Then rinse, pat half-way dry, and apply a gentle, unscented moisturizer cream or ointment (Cerave, Cetaphil, Eucerin, Aveeno, Aquaphor, Vanicream, Vaseline)  all over while still damp. Dry skin makes the itching and rash of eczema worse. The skin should be moisturized with a gentle, unscented moisturizer at least twice daily.  - Use only unscented liquid laundry detergent. - Apply prescribed topical steroid (mometasone  0.1% below neck or hydrocortisone  2.5% above neck) to flared areas (red and thickened eczema) after the moisturizer has soaked into the skin (wait at least 30 minutes). Taper off the topical steroids as the skin improves. Do not use topical steroid for more than 7-10 days at a time.  - Put Protopic  0.1% onto areas of rough eczema twice a day. May decrease to once a day as the eczema improves. This will not thin the skin, and is safe for chronic use. Do not put this onto normal appearing skin.  Corneal Ulcer/Keratoconus/Hyperpigmentation Around Eyes  - Follow up with [facility] ASAP-  [phone]    ALLERGEN AVOIDANCE MEASURES   Dust Mites Use central air conditioning and heat; and change the filter monthly.  Pleated filters work better than mesh filters.  Electrostatic filters may also be used; wash the filter  monthly.  Window air conditioners may be used, but do not clean the air as well as a central air conditioner.  Change or wash the filter monthly. Keep windows closed.  Do not use attic fans.   Encase the mattress, box springs and pillows with zippered, dust proof covers. Wash the bed linens in hot water weekly.   Remove carpet, especially from the bedroom. Remove stuffed animals, throw pillows, dust ruffles, heavy drapes and other items that collect dust from the bedroom. Do not use a humidifier.   Use wood, vinyl or leather furniture instead of cloth furniture in the bedroom. Keep the indoor humidity at 30 - 40%.   Molds - Indoor avoidance Use air conditioning to reduce indoor humidity.  Do not use a humidifier. Keep indoor humidity at 30 - 40%.  Use a dehumidifier if needed. In the bathroom use an exhaust fan or open a window after showering.  Wipe down damp surfaces after showering.  Clean bathrooms with a mold-killing solution (diluted bleach, or products like Tilex, etc) at least once a month. In the kitchen use an exhaust fan to remove steam from cooking.  Throw away spoiled foods immediately, and empty garbage daily.  Empty water pans below self-defrosting refrigerators frequently. Vent the clothes dryer to the outside. Limit indoor houseplants; mold grows in the dirt.  No houseplants in the bedroom. Remove carpet from the bedroom. Encase the mattress and box springs with a zippered encasing.  Molds -  Outdoor avoidance Avoid being outside when the grass is being mowed, or the ground is tilled. Avoid playing in leaves, pine straw, hay, etc.  Dead plant materials contain mold. Avoid going into barns or grain storage areas. Remove leaves, clippings and compost from around the home.  Cockroach Limit spread of food around the house; especially keep food out of bedrooms. Keep food and garbage in closed containers with a tight lid.  Never leave food out in the kitchen.  Do not leave out  pet food or dirty food bowls. Mop the kitchen floor and wash countertops at least once a week. Repair leaky pipes and faucets so there is no standing water to attract roaches. Plug up cracks in the house through which cockroaches can enter. Use bait stations and approved pesticides to reduce cockroach infestation. Pollen Avoidance Pollen levels are highest during the mid-day and afternoon.  Consider this when planning outdoor activities. Avoid being outside when the grass is being mowed, or wear a mask if the pollen-allergic person must be the one to mow the grass. Keep the windows closed to keep pollen outside of the home. Use an air conditioner to filter the air. Take a shower, wash hair, and change clothing after working or playing outdoors during pollen season. Pet Dander- Cats/Dogs  Keep the pet out of your bedroom and restrict it to only a few rooms. Be advised that keeping the pet in only one room will not limit the allergens to that room. Don't pet, hug or kiss the pet; if you do, wash your hands with soap and water. High-efficiency particulate air (HEPA) cleaners run continuously in a bedroom or living room can reduce allergen levels over time. Regular use of a high-efficiency vacuum cleaner or a central vacuum can reduce allergen levels. Giving your pet a bath at least once a week can reduce airborne allergen.      Return in about 3 months (around 10/08/2024).  Arleta Blanch, MD [facility] 

## 2024-07-09 NOTE — Patient Instructions (Addendum)
 Allergic Rhinitis: - SPT 06/2024: positive to trees, grasses, weeds, molds, cats, dogs, dust mites, cockroach  - Use nasal saline rinses before nose sprays such as with Neilmed Sinus Rinse.  Use distilled water.   - Use Flonase  2 sprays each nostril daily. Aim upward and outward. - Use Azelastine 2 sprays each nostril daily  Aim upward and outward.  - Use Zyrtec  10 mg daily.  - Use Singulair  10mg  daily. Stop if there are any mood/behavioral changes.  Moderate Persistent Asthma: - Maintenance inhaler: continue Dulera  2 puffs twice daily and Singulair  10mg  daily.  - Rescue inhaler: Albuterol  2 puffs via spacer or 1 vial via nebulizer every 4-6 hours as needed for respiratory symptoms of cough, shortness of breath, or wheezing Asthma control goals:  Full participation in all desired activities (may need albuterol  before activity) Albuterol  use two times or less a week on average (not counting use with activity) Cough interfering with sleep two times or less a month Oral steroids no more than once a year No hospitalizations  Eczema Hyperpigmentation around Eyes - SPT 06/2024: negative to commonly allergenic foods  - Follow up with Dermatology Dr Hester [phone].  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  - Do a daily soaking tub bath in warm water for 10-15 minutes.  - Use a gentle, unscented cleanser at the end of the bath (such as Dove unscented bar or baby wash, or Aveeno sensitive body wash). Then rinse, pat half-way dry, and apply a gentle, unscented moisturizer cream or ointment (Cerave, Cetaphil, Eucerin, Aveeno, Aquaphor, Vanicream, Vaseline)  all over while still damp. Dry skin makes the itching and rash of eczema worse. The skin should be moisturized with a gentle, unscented moisturizer at least twice daily.  - Use only unscented liquid laundry detergent. - Apply prescribed topical steroid (mometasone  0.1% below neck or  hydrocortisone  2.5% above neck) to flared areas (red and thickened eczema) after the moisturizer has soaked into the skin (wait at least 30 minutes). Taper off the topical steroids as the skin improves. Do not use topical steroid for more than 7-10 days at a time.  - Put Protopic  0.1% onto areas of rough eczema twice a day. May decrease to once a day as the eczema improves. This will not thin the skin, and is safe for chronic use. Do not put this onto normal appearing skin.  Corneal Ulcer/Keratoconus/Hyperpigmentation Around Eyes  - Follow up with [facility] ASAP-  [phone]    ALLERGEN AVOIDANCE MEASURES   Dust Mites Use central air conditioning and heat; and change the filter monthly.  Pleated filters work better than mesh filters.  Electrostatic filters may also be used; wash the filter monthly.  Window air conditioners may be used, but do not clean the air as well as a central air conditioner.  Change or wash the filter monthly. Keep windows closed.  Do not use attic fans.   Encase the mattress, box springs and pillows with zippered, dust proof covers. Wash the bed linens in hot water weekly.   Remove carpet, especially from the bedroom. Remove stuffed animals, throw pillows, dust ruffles, heavy drapes and other items that collect dust from the bedroom. Do not use a humidifier.   Use wood, vinyl or leather furniture instead of cloth furniture in the bedroom. Keep the indoor humidity at 30 - 40%.   Molds - Indoor avoidance Use air conditioning to reduce indoor humidity.  Do not use  a humidifier. Keep indoor humidity at 30 - 40%.  Use a dehumidifier if needed. In the bathroom use an exhaust fan or open a window after showering.  Wipe down damp surfaces after showering.  Clean bathrooms with a mold-killing solution (diluted bleach, or products like Tilex, etc) at least once a month. In the kitchen use an exhaust fan to remove steam from cooking.  Throw away spoiled foods  immediately, and empty garbage daily.  Empty water pans below self-defrosting refrigerators frequently. Vent the clothes dryer to the outside. Limit indoor houseplants; mold grows in the dirt.  No houseplants in the bedroom. Remove carpet from the bedroom. Encase the mattress and box springs with a zippered encasing.  Molds - Outdoor avoidance Avoid being outside when the grass is being mowed, or the ground is tilled. Avoid playing in leaves, pine straw, hay, etc.  Dead plant materials contain mold. Avoid going into barns or grain storage areas. Remove leaves, clippings and compost from around the home.  Cockroach Limit spread of food around the house; especially keep food out of bedrooms. Keep food and garbage in closed containers with a tight lid.  Never leave food out in the kitchen.  Do not leave out pet food or dirty food bowls. Mop the kitchen floor and wash countertops at least once a week. Repair leaky pipes and faucets so there is no standing water to attract roaches. Plug up cracks in the house through which cockroaches can enter. Use bait stations and approved pesticides to reduce cockroach infestation. Pollen Avoidance Pollen levels are highest during the mid-day and afternoon.  Consider this when planning outdoor activities. Avoid being outside when the grass is being mowed, or wear a mask if the pollen-allergic person must be the one to mow the grass. Keep the windows closed to keep pollen outside of the home. Use an air conditioner to filter the air. Take a shower, wash hair, and change clothing after working or playing outdoors during pollen season. Pet Dander- Cats/Dogs  Keep the pet out of your bedroom and restrict it to only a few rooms. Be advised that keeping the pet in only one room will not limit the allergens to that room. Don't pet, hug or kiss the pet; if you do, wash your hands with soap and water. High-efficiency particulate air (HEPA) cleaners run continuously  in a bedroom or living room can reduce allergen levels over time. Regular use of a high-efficiency vacuum cleaner or a central vacuum can reduce allergen levels. Giving your pet a bath at least once a week can reduce airborne allergen.

## 2024-07-09 NOTE — Addendum Note (Signed)
 Addended by: MENDEZ-MUNGARAY, Jamy Whyte M on: 07/09/2024 05:22 PM   Modules accepted: Orders

## 2024-08-27 ENCOUNTER — Ambulatory Visit: Payer: Self-pay | Attending: Internal Medicine | Admitting: Internal Medicine

## 2024-08-27 ENCOUNTER — Encounter: Payer: Self-pay | Admitting: Internal Medicine

## 2024-08-27 VITALS — BP 108/67 | HR 76 | Temp 97.8°F | Resp 16 | Ht 64.0 in | Wt 197.0 lb

## 2024-08-27 DIAGNOSIS — L2089 Other atopic dermatitis: Secondary | ICD-10-CM | POA: Diagnosis present

## 2024-08-27 DIAGNOSIS — L732 Hidradenitis suppurativa: Secondary | ICD-10-CM | POA: Diagnosis present

## 2024-08-27 DIAGNOSIS — G8929 Other chronic pain: Secondary | ICD-10-CM | POA: Diagnosis present

## 2024-08-27 DIAGNOSIS — M5442 Lumbago with sciatica, left side: Secondary | ICD-10-CM | POA: Insufficient documentation

## 2024-08-27 DIAGNOSIS — Z79899 Other long term (current) drug therapy: Secondary | ICD-10-CM | POA: Diagnosis present

## 2024-08-27 DIAGNOSIS — E559 Vitamin D deficiency, unspecified: Secondary | ICD-10-CM | POA: Diagnosis present

## 2024-08-27 NOTE — Patient Instructions (Signed)
 Angela Spencer

## 2024-08-27 NOTE — Progress Notes (Signed)
 Office Visit Note  Patient: Angela Spencer             Date of Birth: 10-04-1991           MRN: [id number]             PCP: Toribio Jerel MATSU, MD Referring: Toribio Jerel MATSU, MD Visit Date: 08/27/2024 Occupation: Data Unavailable  Subjective:  New Patient (Initial Visit) (Eyes want clear up, have seen several different Drs ), Joint Pain (Hands lock up. Knees), and Arthritis (Back pain)   Discussed the use of AI scribe software for clinical note transcription with the patient, who gave verbal consent to proceed.  History of Present Illness   JALEEAH Spencer is a 33 year old female with hidradenitis suppurativa and chronic pain who presents for evaluation of her skin and joint symptoms. She was referred by Dr. Toribio for evaluation of her chronic pain and skin issues.  She has been dealing with hidradenitis suppurativa since age 60, with a formal diagnosis made just in the past five years. The condition primarily affects her groin and underarms, with occasional flares under her breasts during the summer. She has been prescribed doxycycline , which has not been effective, and she is not using any topical treatments currently. The condition flares up at least once a month in her groin and underarms.  She experiences chronic pain affecting her ability to work, as she is employed in a warehouse for ten hours a day. She describes her pain as excessive and is seeking help to manage it. She has fought for disability for three years without success due to a lack of proper diagnoses. She experiences widespread pain from her neck to her toes, with numbness in her left leg and foot when standing for prolonged periods.  She reports a long-standing history of eye problems, with symptoms worsening over the past three to four years. She describes scarring that began approximately ten years ago. Despite various treatments, including antibiotic ointments and evaluations by ophthalmologists and allergists, her  symptoms persist. Her condition is not due to an allergic reaction, and treatments have not provided relief.  She has a history of eczema and asthma, and has been treated with Dupixent  and tacrolimus  for her skin issues, but these have not been effective. She initially thought her skin issues were related to eczema, as she had similar symptoms in her youth, but the current condition does not clear up. Tests for MRSA were negative.  She has a family history of lupus and rheumatoid arthritis, as her mother has both conditions. Her own testing for these condition has has been negative.     02/2024 ANA neg RF neg CCP neg ESR wnl  05/2022 HBV neg   Activities of Daily Living:  Patient reports morning stiffness for 30 minutes.   Patient Reports nocturnal pain.  Difficulty dressing/grooming: Reports Difficulty climbing stairs: Reports Difficulty getting out of chair: Reports Difficulty using hands for taps, buttons, cutlery, and/or writing: Reports  Review of Systems  Constitutional:  Positive for fatigue.  HENT:  Positive for mouth dryness. Negative for mouth sores.   Eyes:  Positive for double vision, photophobia, pain, discharge, redness, itching, visual disturbance and dryness.  Respiratory:  Positive for shortness of breath.   Cardiovascular:  Positive for chest pain. Negative for palpitations.  Gastrointestinal:  Positive for diarrhea. Negative for blood in stool and constipation.  Endocrine: Negative for increased urination.  Genitourinary:  Positive for involuntary urination.  Musculoskeletal:  Positive  for joint pain, gait problem, joint pain, joint swelling, myalgias, muscle weakness, morning stiffness, muscle tenderness and myalgias.  Skin:  Positive for rash, hair loss and sensitivity to sunlight. Negative for color change.  Allergic/Immunologic: Positive for susceptible to infections.  Neurological:  Positive for headaches. Negative for dizziness.  Hematological:  Positive  for swollen glands.  Psychiatric/Behavioral:  Positive for depressed mood and sleep disturbance. The patient is not nervous/anxious.     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/2014   Anxiety disorder 07/12/2014   Atopic dermatitis 12/03/2011   Sebaceous cyst 12/03/2011    Past Medical History:  Diagnosis Date   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; resolved Oct 2013 history of blunt force trauma    Angio-edema    Anxiety    Asthma    Bell's palsy 2005   Depression    Eczema    Migraine headache    Multiple allergies    Psychotic disorder (HCC)    Seizures (HCC)    focal seizures   Stroke (HCC) 11/2020    Family History  Problem Relation Age of Onset   Asthma Mother    Allergic rhinitis Mother    Lupus Mother    Anxiety disorder Mother    Depression Mother    Psoriasis Mother    Rheum arthritis Mother    Depression Father    Allergic rhinitis Father    Cancer Father    Anxiety disorder Father    Asthma Sister    Anxiety disorder Sister    Healthy Sister    Healthy Sister    Allergic rhinitis Brother    Asthma Brother    Asthma Brother    Eczema Brother    Allergic rhinitis Brother    Hypertension Maternal Grandmother    Diabetes Paternal Grandmother    Hypertension Paternal Grandmother    Allergic rhinitis Son    Eczema Son    Asthma Son    Autism Son    Sleep apnea Son    Allergies Son    Healthy Son    Past Surgical History:  Procedure Laterality Date   ADENOIDECTOMY     arm surgery     broken left arm  at elbow   CESAREAN SECTION  05/19/2022   MYRINGOTOMY WITH TUBE PLACEMENT Bilateral 11/07/2017   Procedure: BILATERAL MYRINGOTOMY WITH TUBE PLACEMENT;  Surgeon: Karis Clunes, MD;  Location: [facility];  Service: ENT;  Laterality: Bilateral;   TONSILLECTOMY     TRANSFUSION BLOOD OR BLOOD COMPONENTS  05/2022   x2   TYMPANOSTOMY TUBE PLACEMENT     Social History   Tobacco Use   Smoking status: Former    Current packs/day: 0.00    Types: Cigarettes    Quit date: 11/02/2016    Years since quitting: 7.8    Passive exposure: Past   Smokeless tobacco: Never  Vaping Use   Vaping status: Never Used  Substance Use Topics   Alcohol use: Yes    Comment: occ   Drug use: Not Currently  Types: Marijuana    Comment: 11/01/17   Social History   Social History Narrative   Not on file     Immunization History  Administered Date(s) Administered   HPV 9-valent 11/02/2016   Hepatitis A, Adult 11/02/2016   Influenza-Unspecified 12/11/2017   PPD Test 01/02/2019     Objective: Vital Signs: BP 108/67 (BP Location: Left Arm, Patient Position: Sitting, Cuff Size: Normal)   Pulse 76   Temp 97.8 F (36.6 C)   Resp 16   Ht 5' 4 (1.626 m)   Wt 197 lb (89.4 kg)   LMP 08/24/2024   BMI 33.81 kg/m    Physical Exam Constitutional:      Appearance: She is obese.  Eyes:     Conjunctiva/sclera: Conjunctivae normal.  Cardiovascular:     Rate and Rhythm: Normal rate and regular rhythm.  Pulmonary:     Effort: Pulmonary effort is normal.     Breath sounds: Normal breath sounds.  Musculoskeletal:     Right lower leg: No edema.     Left lower leg: No edema.  Lymphadenopathy:     Cervical: No cervical adenopathy.  Skin:    General: Skin is warm and dry.     Findings: Rash present.     Comments: Hyperpigmentation at medial corder of eyes Multiple currently closed cyst changes in axillae Widespread papular and excoriated rashes across upper chest  Neurological:     Mental  Status: She is alert.  Psychiatric:        Mood and Affect: Mood normal.          Musculoskeletal Exam:  Shoulders full ROM no tenderness or swelling Elbows full ROM no tenderness or swelling Wrists full ROM no tenderness or swelling Fingers full ROM no tenderness or swelling Low back midline and paraspinal muscle tenderness to pressure, guarding no radiation Knees full ROM no tenderness or swelling    Investigation: No additional findings.  Imaging: No results found.  Recent Labs: Lab Results  Component Value Date   WBC 14.6 (H) 11/11/2020   HGB 14.5 11/11/2020   PLT 391 11/11/2020   NA 139 11/11/2020   K 3.4 (L) 11/11/2020   CL 106 11/11/2020   CO2 21 (L) 11/11/2020   GLUCOSE 104 (H) 11/11/2020   BUN 11 11/11/2020   CREATININE 0.82 11/11/2020   BILITOT 0.6 07/11/2014   ALKPHOS 68 07/11/2014   AST 13 07/11/2014   ALT 10 07/11/2014   PROT 8.2 07/11/2014   ALBUMIN 4.2 07/11/2014   CALCIUM 9.5 11/11/2020   GFRAA >60 08/21/2019    Speciality Comments: No specialty comments available.  Procedures:  No procedures performed Allergies: Flexeril [cyclobenzaprine hcl], Penicillins, and Tramadol   Assessment / Plan:     Visit Diagnoses: Hidradenitis suppurativa - Plan: Sedimentation rate, C-reactive protein, C3 and C4 Chronic hidradenitis suppurativa affecting groin and axillae, refractory to doxycycline , topical antibiotics and topical immunomodulatory treatment. Discussed inflammatory nature and bacterial role in inflammation.  She may also be good candidate for trial of Rinvoq if not responsive given the overlap with moderate to severe atopic dermatitis. - Initiated Humira (adalimumab) 40 mg [place] biweekly. - Provided consent form and drug information.  High risk medication use - Plan: Hepatitis C antibody, QuantiFERON-TB Gold Plus Does not have any major contraindications.  Discussed risks of Humira including injection site reactions, infections, cytopenias or  hepatotoxicity with need for lab monitoring, or malignancy with long-term treatment.  Recent outside labs reviewed including blood count metabolic panel in  normal range. - Checking baseline labs for starting Humira including hepatitis C and QuantiFERON screening  Vitamin D  deficiency - Plan: VITAMIN D  25 Hydroxy (Vit-D Deficiency, Fractures) Checking vitamin D  level, suspect deficiency given demographics, work history, and obesity. Discussed how this can impact immune function particularly in skin.  Eczema Chronic eczema part of broader inflammatory condition addressed with Humira. - Monitor response to Humira for eczema symptoms.  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 Autoimmune etiology considered. Does not look typical for inflammatory eye disease, possibly just severe eczema process but symptoms are quite bad and refractory so far.  Degenerative disc disease of the lumbar spine Degenerative disc disease with chronic pain and numbness, likely facet joint involvement. Imaging shows degenerative changes.       Orders: Orders Placed This Encounter  Procedures   Sedimentation rate   C-reactive protein   C3 and C4   Hepatitis C antibody   QuantiFERON-TB Gold Plus   VITAMIN D  25 Hydroxy (Vit-D Deficiency, Fractures)   No orders of the defined types were placed in this encounter.    Follow-Up Instructions: Return in about 3 months (around 11/27/2024) for HS/AD/?eye ADA start f/u 3mos.   Lonni LELON Ester, MD  Note - This record has been created using [employer].  Chart creation errors have been sought, but may not always  have been located. Such creation errors do not reflect on  the standard of medical care.

## 2024-08-28 ENCOUNTER — Ambulatory Visit: Payer: Self-pay | Admitting: Internal Medicine

## 2024-08-28 NOTE — Progress Notes (Signed)
 Lab results look fine for starting Humira as planned. Vitamin D was low at 24. I recommend adding a supplement with 2000 units of vitamin D3 daily.

## 2024-08-29 LAB — QUANTIFERON-TB GOLD PLUS
Mitogen-NIL: >10 [IU]/mL
NIL: 0.03 [IU]/mL
QuantiFERON-TB Gold Plus: NEGATIVE
TB1-NIL: 0 [IU]/mL
TB2-NIL: 0 [IU]/mL

## 2024-08-29 LAB — HEPATITIS C ANTIBODY: Hepatitis C Ab: NONREACTIVE

## 2024-08-29 LAB — C-REACTIVE PROTEIN: CRP: <3 mg/L (ref <8.0)

## 2024-08-29 LAB — SEDIMENTATION RATE: Sed Rate: 2 mm/h (ref 0–20)

## 2024-08-29 LAB — C3 AND C4
C3 Complement: 116 mg/dL (ref 83–193)
C4 Complement: 24 mg/dL (ref 15–57)

## 2024-08-29 LAB — VITAMIN D 25 HYDROXY (VIT D DEFICIENCY, FRACTURES): Vit D, 25-Hydroxy: 24 ng/mL — ABNORMAL LOW (ref 30–100)

## 2024-09-11 ENCOUNTER — Telehealth: Payer: Self-pay | Admitting: Pharmacist

## 2024-09-11 DIAGNOSIS — L732 Hidradenitis suppurativa: Secondary | ICD-10-CM

## 2024-09-11 DIAGNOSIS — Z79899 Other long term (current) drug therapy: Secondary | ICD-10-CM

## 2024-09-11 NOTE — Telephone Encounter (Signed)
 error

## 2024-09-11 NOTE — Telephone Encounter (Addendum)
 Submitted a Prior Authorization request to [facility] MEDICAID for HUMIRA via CoverMyMeds. Will update once we receive a response.  Key: [id number]    ----- Message from Lonni LELON Ester sent at 09/09/2024  3:54 PM EST ----- Need to start BIV for adalimumab 40 mg [place] q14days for her hidradenitis suppurativa. Previously failed oral and topical antibiotics. Thanks.

## 2024-09-12 ENCOUNTER — Other Ambulatory Visit: Payer: Self-pay

## 2024-09-12 ENCOUNTER — Other Ambulatory Visit (HOSPITAL_COMMUNITY): Payer: Self-pay

## 2024-09-12 MED ORDER — HUMIRA (2 PEN) 40 MG/0.4ML ~~LOC~~ AJKT
40.0000 mg | AUTO-INJECTOR | SUBCUTANEOUS | 0 refills | Status: AC
  Filled 2024-09-12: qty 2, 28d supply, fill #0
  Filled 2024-10-03: qty 2, 28d supply, fill #1
  Filled 2024-10-30 – 2024-11-13 (×2): qty 2, 28d supply, fill #2

## 2024-09-12 NOTE — Progress Notes (Signed)
 Specialty Pharmacy Initial Fill Coordination Note  Angela Spencer is a 33 y.o. female contacted today regarding initial fill of specialty medication(s) Adalimumab (Humira (2 Pen))   Patient requested Delivery   Delivery date: 09/14/24   Verified address: [address]   [place] [id number]   Medication will be filled on: 09/13/24   Patient is aware of $4 copayment and would like to have it billed to AR account.

## 2024-09-12 NOTE — Telephone Encounter (Signed)
 Received notification from [facility] MEDICAID regarding a prior authorization for HUMIRA. Authorization has been APPROVED from 09/11/2024 to 09/11/2025. Approval letter sent to scan center.  Per test claim, copay for 28 days supply is $4  Patient can fill through [facility]: [phone]   Authorization # [id number]  Rx sent to [facility]. MyChart message sent to patient - advised that [facility] will be calling to set up shipment to home.  Sherry Pennant, PharmD, MPH, BCPS, CPP Clinical Pharmacist [facility])

## 2024-09-13 ENCOUNTER — Other Ambulatory Visit: Payer: Self-pay

## 2024-09-14 NOTE — Progress Notes (Signed)
 Patient starting Humira  for HS  Dose: 40mg  subcut every 14 days  She has failed oral and topical abx  Repeat CBC/CMP in 1 month every 3 months TB gold yearly  F/u schedled for Feb 2026 with Dr. Jeannetta.  Sherry Pennant, PharmD, MPH, BCPS, CPP Clinical Pharmacist [facility])

## 2024-10-02 ENCOUNTER — Other Ambulatory Visit (HOSPITAL_COMMUNITY): Payer: Self-pay

## 2024-10-03 ENCOUNTER — Other Ambulatory Visit (HOSPITAL_COMMUNITY): Payer: Self-pay

## 2024-10-08 ENCOUNTER — Other Ambulatory Visit (HOSPITAL_COMMUNITY): Payer: Self-pay

## 2024-10-08 ENCOUNTER — Other Ambulatory Visit: Payer: Self-pay

## 2024-10-08 NOTE — Progress Notes (Signed)
 Specialty Pharmacy Refill Coordination Note  Angela Spencer is a 33 y.o. female contacted today regarding refills of specialty medication(s) Adalimumab  (Humira  (2 Pen))   Patient requested Delivery   Delivery date: 10/10/24   Verified address: [address]   [place] [id number]   Medication will be filled on: 10/09/24

## 2024-10-09 ENCOUNTER — Other Ambulatory Visit: Payer: Self-pay

## 2024-10-15 ENCOUNTER — Ambulatory Visit: Admitting: Internal Medicine

## 2024-10-15 ENCOUNTER — Encounter: Payer: Self-pay | Admitting: Internal Medicine

## 2024-10-15 VITALS — BP 132/86 | HR 81 | Temp 97.4°F | Resp 18 | Wt 185.0 lb

## 2024-10-15 DIAGNOSIS — J4540 Moderate persistent asthma, uncomplicated: Secondary | ICD-10-CM

## 2024-10-15 DIAGNOSIS — J3089 Other allergic rhinitis: Secondary | ICD-10-CM

## 2024-10-15 DIAGNOSIS — L2084 Intrinsic (allergic) eczema: Secondary | ICD-10-CM

## 2024-10-15 DIAGNOSIS — J302 Other seasonal allergic rhinitis: Secondary | ICD-10-CM | POA: Diagnosis not present

## 2024-10-15 MED ORDER — CETIRIZINE HCL 5 MG/5ML PO SOLN: 10.0000 mg | Freq: Every day | ORAL | 1 refills | Status: AC

## 2024-10-15 MED ORDER — FLUTICASONE PROPIONATE 50 MCG/ACT NA SUSP: 2.0000 | Freq: Every day | NASAL | 1 refills | Status: AC

## 2024-10-15 MED ORDER — TACROLIMUS 0.1 % EX OINT: TOPICAL_OINTMENT | Freq: Two times a day (BID) | CUTANEOUS | 5 refills | Status: AC

## 2024-10-15 MED ORDER — DULERA 200-5 MCG/ACT IN AERO: 2.0000 | INHALATION_SPRAY | Freq: Two times a day (BID) | RESPIRATORY_TRACT | 1 refills | Status: AC

## 2024-10-15 MED ORDER — ALBUTEROL SULFATE HFA 108 (90 BASE) MCG/ACT IN AERS: 1.0000 | INHALATION_SPRAY | Freq: Four times a day (QID) | RESPIRATORY_TRACT | 1 refills | Status: AC | PRN

## 2024-10-15 MED ORDER — MONTELUKAST SODIUM 10 MG PO TABS: 10.0000 mg | ORAL_TABLET | Freq: Every day | ORAL | 1 refills | Status: AC

## 2024-10-15 MED ORDER — AZELASTINE HCL 0.1 % NA SOLN: 2.0000 | Freq: Two times a day (BID) | NASAL | 1 refills | Status: AC

## 2024-10-15 MED ORDER — TRIAMCINOLONE ACETONIDE 0.1 % EX OINT: TOPICAL_OINTMENT | CUTANEOUS | 1 refills | Status: AC

## 2024-10-15 MED ORDER — HYDROCORTISONE 2.5 % EX CREA: TOPICAL_CREAM | CUTANEOUS | 5 refills | Status: AC

## 2024-10-15 NOTE — Patient Instructions (Addendum)
 Allergic Rhinitis: - SPT 06/2024: positive to trees, grasses, weeds, molds, cats, dogs, dust mites, cockroach  - Use nasal saline rinses before nose sprays such as with Neilmed Sinus Rinse.  Use distilled water.   - Use Flonase  2 sprays each nostril daily. Aim upward and outward. - Use Azelastine  2 sprays each nostril twice daily as needed for congestion, drainage, runny nose.  Aim upward and outward.  - Use Zyrtec  10 mg daily.  - Use Singulair  10mg  daily. Stop if there are any mood/behavioral changes. - Consider allergy  shots.   Moderate Persistent Asthma: - Work on breathing exercises.  I think your symptoms are related to panic attacks/anxiety rather than uncontrolled asthma.  - Maintenance inhaler: continue Dulera  2 puffs twice daily and Singulair  10mg  daily.  - Rescue inhaler: Albuterol  2 puffs via spacer or 1 vial via nebulizer every 4-6 hours as needed for respiratory symptoms of cough, shortness of breath, or wheezing Asthma control goals:  Full participation in all desired activities (may need albuterol  before activity) Albuterol  use two times or less a week on average (not counting use with activity) Cough interfering with sleep two times or less a month Oral steroids no more than once a year No hospitalizations  Eczema Hyperpigmentation around Eyes - On Humira  for HS and doing better.  Can consider adding Dupixent  in future.  - Follow up with Dermatology Dr Hester.   - Do a daily soaking tub bath in warm water for 10-15 minutes.  - Use a gentle, unscented cleanser at the end of the bath (such as Dove unscented bar or baby wash, or Aveeno sensitive body wash). Then rinse, pat half-way dry, and apply a gentle, unscented moisturizer cream or ointment (Cerave, Cetaphil, Eucerin, Aveeno, Aquaphor, Vanicream, Vaseline)  all over while still damp. Dry skin makes the itching and rash of eczema worse. The skin should be moisturized with a gentle, unscented moisturizer at least twice  daily.  - Use only unscented liquid laundry detergent. - Apply prescribed topical steroid (mometasone  or triamcinolone  0.1% below neck or hydrocortisone  2.5% above neck) to flared areas (red and thickened eczema) after the moisturizer has soaked into the skin (wait at least 30 minutes). Taper off the topical steroids as the skin improves. Do not use topical steroid for more than 7-10 days at a time.  - Put Protopic  0.1% onto areas of rough eczema twice a day. May decrease to once a day as the eczema improves. This will not thin the skin, and is safe for chronic use. Do not put this onto normal appearing skin.  Corneal Ulcer/Keratoconus - Follow up with [facility].

## 2024-10-15 NOTE — Progress Notes (Signed)
 "  FOLLOW UP Date of Service/Encounter:  10/15/2024   Subjective:  Angela Spencer (DOB: 10-30-1990) is a 34 y.o. female who [facility] for follow up for allergic rhinitis, asthma, eczema.   History obtained from: chart review and patient. Last seen by me on 07/09/2024 for skin testing; discussed f/u with Dermatology who had her on Dupixent  with plans for possibly Rinvoq.  On Dulera /Singulair  for asthma.  For allergies, use Flonase /Azelastine /Zyrtec .  Also f/u with Optho for corneal ulcers.   Notes seeing Rheumatology for hidradenitis suppurativa and being starting on Humira .  Has received 2 shots and notes some improvement in her eczema due to this also. Rashes around eyes are less prominent and less itchy.  Still has few patches on arms though. Tries to moisturize daily and uses topical steroids for flare ups but not using Protopic  consistently.  Planning to see Dermatology in March.   Does note trouble with sudden onset of shortness of breath, can occur even at rest but mostly when working. She works in a [employer].  Takes Albuterol  almost daily. Also notes issues with anxiety and panic attack and has a hard time telling her asthma apart from this.  Taking Dulera  and Singulair  daily.  No ER/urgent care/oral prednisone  use since last visit.  Does note some congestion and post nasal drainage.  Not using her nose sprays consistently, takes Zyrtec  daily. Would prefer Zyrtec  liquid.   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 Physical Exam: GEN: alert, well developed HEENT: clear conjunctiva, nose with mild inferior turbinate hypertrophy, pink nasal mucosa, + clear rhinorrhea, + cobblestoning HEART: regular rate and rhythm, no murmur LUNGS: clear to auscultation bilaterally, no coughing, unlabored respiration SKIN: few dried hyperpigmented patches on bl arms, hyperpigmentation and dryness noted around eyes   Spirometry:  Tracings reviewed. Her effort: Good reproducible efforts. FVC: 3.35L, 111% predicted  FEV1: 2.58L, 101% predicted FEV1/FVC ratio: 77% Interpretation: Spirometry consistent with normal pattern.  Please see scanned spirometry results for details.  Assessment:   1. Intrinsic atopic dermatitis   2. Seasonal and perennial allergic rhinitis   3. Moderate persistent asthma without complication     Plan/Recommendations:  Allergic Rhinitis: - Uncontrolled, restart nasal sprays.  - SPT 06/2024: positive to trees, grasses, weeds, molds, cats, dogs, dust mites, cockroach  - Use nasal saline rinses before nose sprays such as with Neilmed Sinus Rinse.  Use distilled water.   - Use Flonase  2 sprays each nostril daily. Aim upward and outward. - Use Azelastine  2 sprays each nostril twice daily as needed for congestion, drainage, runny nose.  Aim upward and outward.  - Use Zyrtec  10 mg daily.  - Use Singulair  10mg  daily. Stop if there are any mood/behavioral changes. - Consider allergy  shots.   Moderate  Persistent Asthma: - Work on breathing exercises.  I think your symptoms are related to panic attacks/anxiety rather than uncontrolled asthma. Spirometry today was normal.  - Maintenance inhaler: continue Dulera  2 puffs twice daily and Singulair  10mg  daily.  - Rescue inhaler: Albuterol  2 puffs via spacer or 1 vial via nebulizer every 4-6 hours as needed for respiratory symptoms of cough, shortness of breath, or wheezing Asthma control goals:  Full  participation in all desired activities (may need albuterol  before activity) Albuterol  use two times or less a week on average (not counting use with activity) Cough interfering with sleep two times or less a month Oral steroids no more than once a year No hospitalizations  Eczema Hyperpigmentation around Eyes - Improved. On Humira  for HS with Rheum and notes eczema is doing better.  Can consider restarting Dupixent  in future; she was on this with Dermatology in the past.  - Follow up with Dermatology Dr Hester.   - Do a daily soaking tub bath in warm water for 10-15 minutes.  - Use a gentle, unscented cleanser at the end of the bath (such as Dove unscented bar or baby wash, or Aveeno sensitive body wash). Then rinse, pat half-way dry, and apply a gentle, unscented moisturizer cream or ointment (Cerave, Cetaphil, Eucerin, Aveeno, Aquaphor, Vanicream, Vaseline)  all over while still damp. Dry skin makes the itching and rash of eczema worse. The skin should be moisturized with a gentle, unscented moisturizer at least twice daily.  - Use only unscented liquid laundry detergent. - Apply prescribed topical steroid (mometasone  or triamcinolone  0.1% below neck or hydrocortisone  2.5% above neck) to flared areas (red and thickened eczema) after the moisturizer has soaked into the skin (wait at least 30 minutes). Taper off the topical steroids as the skin improves. Do not use topical steroid for more than 7-10 days at a time.  - Put Protopic  0.1% onto areas of rough eczema twice a day. May decrease to once a day as the eczema improves. This will not thin the skin, and is safe for chronic use. Do not put this onto normal appearing skin.  Corneal Ulcer/Keratoconus - Follow up with [facility].        Return in about 3 months (around 01/13/2025).  Arleta Blanch, MD [facility]       "

## 2024-10-30 ENCOUNTER — Other Ambulatory Visit: Payer: Self-pay

## 2024-11-01 ENCOUNTER — Other Ambulatory Visit: Payer: Self-pay

## 2024-11-05 ENCOUNTER — Other Ambulatory Visit: Payer: Self-pay

## 2024-11-08 ENCOUNTER — Other Ambulatory Visit (HOSPITAL_COMMUNITY): Payer: Self-pay

## 2024-11-11 ENCOUNTER — Other Ambulatory Visit (HOSPITAL_COMMUNITY): Payer: Self-pay

## 2024-11-12 ENCOUNTER — Other Ambulatory Visit: Payer: Self-pay

## 2024-11-13 ENCOUNTER — Other Ambulatory Visit: Payer: Self-pay

## 2024-11-13 NOTE — Progress Notes (Signed)
 Specialty Pharmacy Refill Coordination Note  Angela Spencer is a 34 y.o. female contacted today regarding refills of specialty medication(s) Adalimumab  (Humira  (2 Pen))   Patient requested Delivery   Delivery date: 11/14/24   Verified address: [address]   [place] [id number]   Medication will be filled on: 11/13/24  Patient request that we fill this time and then wants medication transferred to preferred pharmacy. Advised patient to reach out the the receiving pharmacy.

## 2024-12-03 ENCOUNTER — Ambulatory Visit: Admitting: Internal Medicine

## 2025-01-14 ENCOUNTER — Ambulatory Visit: Admitting: Family Medicine
